# Patient Record
Sex: Female | Born: 1945 | Race: White | Hispanic: No | Marital: Married | Smoking: Never smoker
Health system: Southern US, Community
[De-identification: ages and names within clinical notes are randomized; demographics above are authoritative.]

## PROBLEM LIST (undated history)

## (undated) DIAGNOSIS — E119 Type 2 diabetes mellitus without complications: Secondary | ICD-10-CM

## (undated) DIAGNOSIS — I10 Essential (primary) hypertension: Secondary | ICD-10-CM

## (undated) DIAGNOSIS — Z780 Asymptomatic menopausal state: Secondary | ICD-10-CM

## (undated) DIAGNOSIS — E785 Hyperlipidemia, unspecified: Secondary | ICD-10-CM

## (undated) DIAGNOSIS — E1169 Type 2 diabetes mellitus with other specified complication: Secondary | ICD-10-CM

## (undated) DIAGNOSIS — F410 Panic disorder [episodic paroxysmal anxiety] without agoraphobia: Secondary | ICD-10-CM

## (undated) DIAGNOSIS — M549 Dorsalgia, unspecified: Secondary | ICD-10-CM

## (undated) DIAGNOSIS — IMO0001 Reserved for inherently not codable concepts without codable children: Secondary | ICD-10-CM

## (undated) DIAGNOSIS — M810 Age-related osteoporosis without current pathological fracture: Secondary | ICD-10-CM

## (undated) HISTORY — DX: Age-related osteoporosis without current pathological fracture: M81.0

## (undated) HISTORY — DX: Type 2 diabetes mellitus without complications: E11.9

## (undated) HISTORY — DX: Type 2 diabetes mellitus with other specified complication: E78.5

## (undated) HISTORY — PX: OTHER SURGICAL HISTORY: SHX169

## (undated) HISTORY — PX: TRIGGER FINGER RELEASE: SHX641

## (undated) HISTORY — DX: Type 2 diabetes mellitus with other specified complication: E11.69

---

## 2009-06-03 ENCOUNTER — Observation Stay (HOSPITAL_COMMUNITY)
Admission: EM | Admit: 2009-06-03 | Discharge: 2009-06-04 | Payer: Self-pay | Source: Home / Self Care | Admitting: Emergency Medicine

## 2009-06-03 ENCOUNTER — Ambulatory Visit: Payer: Self-pay | Admitting: Interventional Radiology

## 2009-06-03 ENCOUNTER — Encounter: Payer: Self-pay | Admitting: Emergency Medicine

## 2009-06-09 ENCOUNTER — Encounter: Admission: RE | Admit: 2009-06-09 | Discharge: 2009-06-09 | Payer: Self-pay | Admitting: Orthopedic Surgery

## 2009-07-04 ENCOUNTER — Encounter
Admission: RE | Admit: 2009-07-04 | Discharge: 2009-10-02 | Payer: Self-pay | Source: Home / Self Care | Admitting: Orthopedic Surgery

## 2009-10-10 ENCOUNTER — Encounter
Admission: RE | Admit: 2009-10-10 | Discharge: 2010-01-08 | Payer: Self-pay | Source: Home / Self Care | Admitting: Orthopedic Surgery

## 2010-04-24 LAB — BASIC METABOLIC PANEL
BUN: 9 mg/dL (ref 6–23)
Chloride: 106 mEq/L (ref 96–112)
Chloride: 107 mEq/L (ref 96–112)
Creatinine, Ser: 0.89 mg/dL (ref 0.4–1.2)
Creatinine, Ser: 0.97 mg/dL (ref 0.4–1.2)
GFR calc Af Amer: >60 mL/min (ref >60)
GFR calc non Af Amer: >60 mL/min (ref >60)
Glucose, Bld: 193 mg/dL — ABNORMAL HIGH (ref 70–99)
Potassium: 3.6 mEq/L (ref 3.5–5.1)
Sodium: 141 mEq/L (ref 135–145)

## 2010-04-24 LAB — DIFFERENTIAL
Basophils Relative: 1 % (ref 0–1)
Eosinophils Absolute: 0 10*3/uL (ref 0.0–0.7)
Lymphocytes Relative: 6 % — ABNORMAL LOW (ref 12–46)
Monocytes Absolute: 0.5 10*3/uL (ref 0.1–1.0)
Monocytes Relative: 4 % (ref 3–12)

## 2010-04-24 LAB — CBC
HCT: 39.3 % (ref 36.0–46.0)
Hemoglobin: 13.3 g/dL (ref 12.0–15.0)
MCHC: 33.8 g/dL (ref 30.0–36.0)
MCV: 87.5 fL (ref 78.0–100.0)
MCV: 86.1 fL (ref 78.0–100.0)
Platelets: 179 10*3/uL (ref 150–400)
RDW: 13.4 % (ref 11.5–15.5)
WBC: 10.6 10*3/uL — ABNORMAL HIGH (ref 4.0–10.5)

## 2011-01-13 IMAGING — CT CT 3D INDEPENDENT [facility]
3 of 4 series · 7 of 14 positions shown, 8 images · non-contrast
Comparison: Left shoulder CT 06/03/2009.

CLINICAL DATA: Shoulder pain status post fall 06/03/2009.
Evaluate for fracture/dislocation.

CT OF THE LEFT SHOULDER WITHOUT CONTRAST
TECHNIQUE: Multidetector CT imaging of the left shoulder was
performed according to the standard protocol without intravenous
contrast. Multiplanar CT image reconstructions were also generated.

[Series 3: shoulder bone · axial · 0.42mm/px · z∈[-154,-12]mm · 3 of 58 slices shown, 4 images]
[im 1/58  soft-tissue]
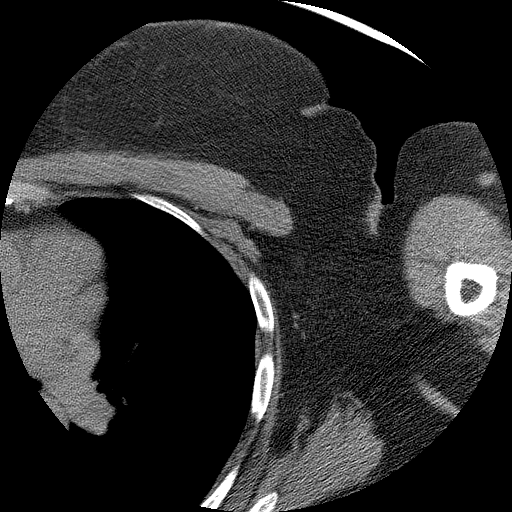
[im 1/58  bone]
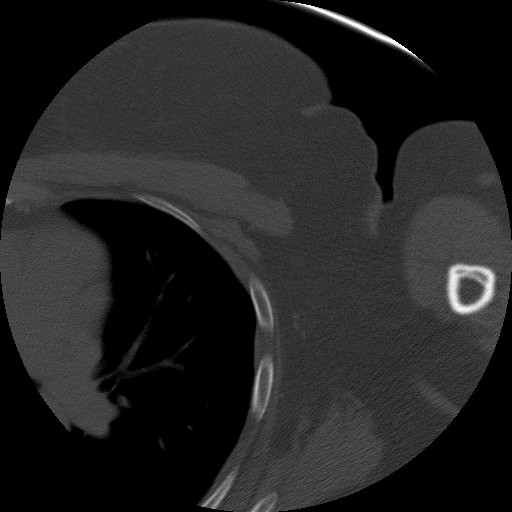
[im 29/58  bone]
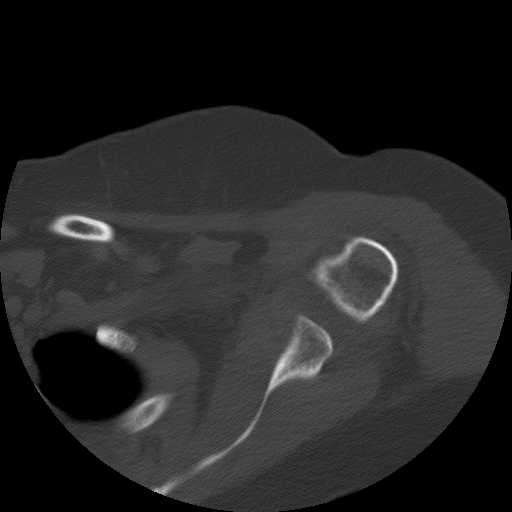
[im 58/58  bone]
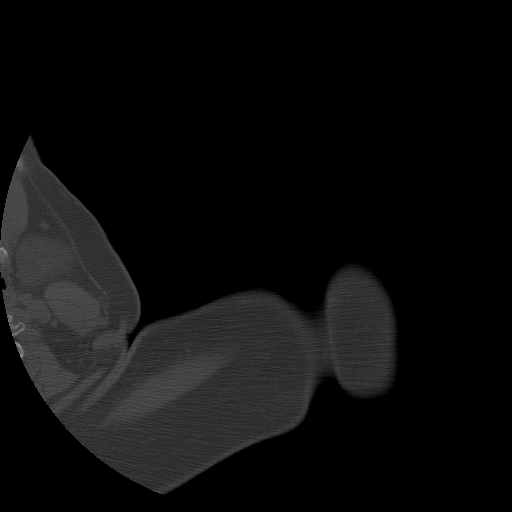

[Series 200: reformat · oblique · 0.42mm/px · 2 of 69 slices shown (1 of 2)]
[im 23/69  bone]
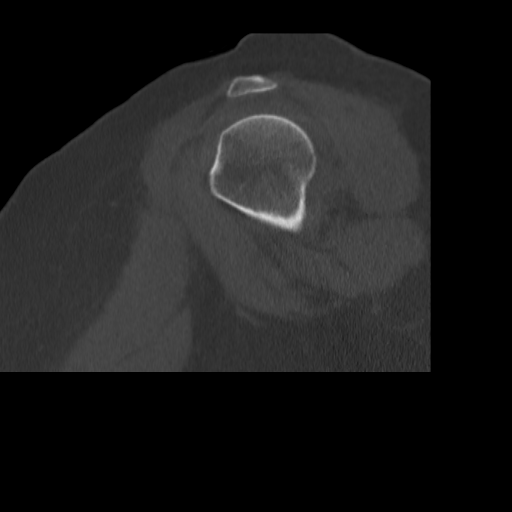
[im 46/69  bone]
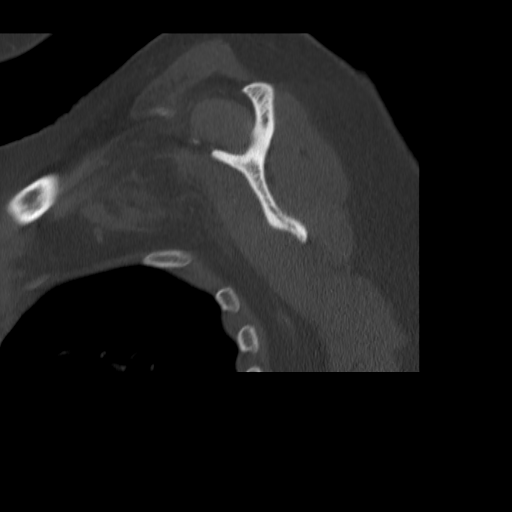

[Series 201: reformat · oblique · 0.42mm/px · 2 of 69 slices shown (2 of 2)]
[im 23/69  bone]
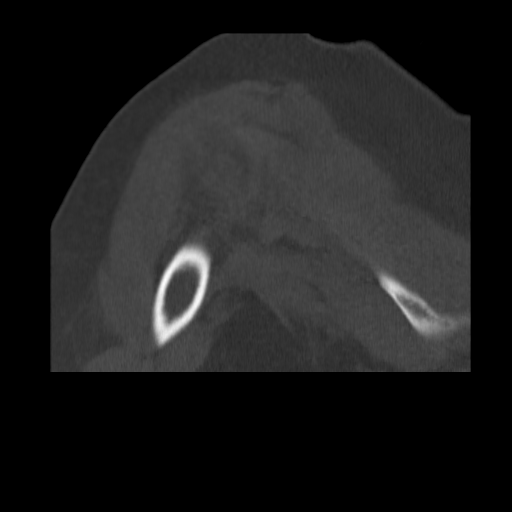
[im 46/69  bone]
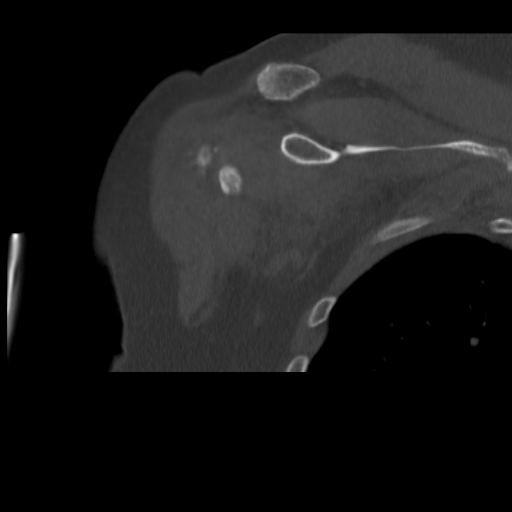

[7 of 14 positions shown; findings below may reference images not displayed]

FINDINGS: Prior examination demonstrated anterior inferior
dislocation of the humeral head with a luxatio erecta deformity.
The dislocation has been reduced.  The humeral head now articulates
normally with the glenoid.  The articular surface of the humeral
head is intact.  There is a comminuted fracture of the greater
tuberosity.  This is mildly displaced.  Components involving the
bicipital groove are not displaced.

There is no evidence of glenoid or other scapular fracture.  There
are moderate acromioclavicular degenerative changes.

Soft tissue edema is noted within the axilla surrounding the
subclavian and axillary vessels.  This is likely related to
hemorrhage from the recent dislocation.  There is no large focal
hematoma.  Injury of the brachial plexus would be possible in this
context.

No rib fracture is identified.  The visualized left lung appears
unremarkable.
IMPRESSION: 1.  Reduction of luxatio erecta deformity.
2.  Comminuted mildly displaced fracture of the left humeral
greater tuberosity.  No involvement of the humeral head articular
surface or glenoid is identified.
3.  Soft tissue injury in the axilla related to recent dislocation.
There is potential injury to the brachial plexus - correlate
clinically.

3-DIMENSIONAL CT IMAGE RENDERING AT INDEPENDENT WORKSTATION:

3-dimensional CT images were rendered by post-processing of the
original CT data at independent workstation.  The 3-dimensional CT
images were interpreted, and findings were reported in the
accompanying complete CT report for this study.

## 2014-02-28 ENCOUNTER — Encounter (HOSPITAL_BASED_OUTPATIENT_CLINIC_OR_DEPARTMENT_OTHER): Payer: Self-pay | Admitting: Emergency Medicine

## 2014-02-28 ENCOUNTER — Emergency Department (HOSPITAL_BASED_OUTPATIENT_CLINIC_OR_DEPARTMENT_OTHER)
Admission: EM | Admit: 2014-02-28 | Discharge: 2014-02-28 | Disposition: A | Discharge status: Home / Self Care | Payer: Worker's Compensation | Attending: Emergency Medicine | Admitting: Emergency Medicine

## 2014-02-28 ENCOUNTER — Emergency Department (HOSPITAL_BASED_OUTPATIENT_CLINIC_OR_DEPARTMENT_OTHER): Payer: Worker's Compensation

## 2014-02-28 DIAGNOSIS — Y9289 Other specified places as the place of occurrence of the external cause: Secondary | ICD-10-CM | POA: Diagnosis not present

## 2014-02-28 DIAGNOSIS — Z79899 Other long term (current) drug therapy: Secondary | ICD-10-CM | POA: Insufficient documentation

## 2014-02-28 DIAGNOSIS — W01198A Fall on same level from slipping, tripping and stumbling with subsequent striking against other object, initial encounter: Secondary | ICD-10-CM | POA: Insufficient documentation

## 2014-02-28 DIAGNOSIS — S0003XA Contusion of scalp, initial encounter: Secondary | ICD-10-CM | POA: Insufficient documentation

## 2014-02-28 DIAGNOSIS — I10 Essential (primary) hypertension: Secondary | ICD-10-CM | POA: Diagnosis not present

## 2014-02-28 DIAGNOSIS — Y998 Other external cause status: Secondary | ICD-10-CM | POA: Insufficient documentation

## 2014-02-28 DIAGNOSIS — F410 Panic disorder [episodic paroxysmal anxiety] without agoraphobia: Secondary | ICD-10-CM | POA: Insufficient documentation

## 2014-02-28 DIAGNOSIS — Y9389 Activity, other specified: Secondary | ICD-10-CM | POA: Insufficient documentation

## 2014-02-28 DIAGNOSIS — Z7952 Long term (current) use of systemic steroids: Secondary | ICD-10-CM | POA: Insufficient documentation

## 2014-02-28 DIAGNOSIS — E118 Type 2 diabetes mellitus with unspecified complications: Secondary | ICD-10-CM | POA: Diagnosis not present

## 2014-02-28 DIAGNOSIS — S0990XA Unspecified injury of head, initial encounter: Secondary | ICD-10-CM | POA: Diagnosis present

## 2014-02-28 DIAGNOSIS — W19XXXA Unspecified fall, initial encounter: Secondary | ICD-10-CM

## 2014-02-28 HISTORY — DX: Type 2 diabetes mellitus without complications: E11.9

## 2014-02-28 HISTORY — DX: Asymptomatic menopausal state: Z78.0

## 2014-02-28 HISTORY — DX: Dorsalgia, unspecified: M54.9

## 2014-02-28 HISTORY — DX: Reserved for inherently not codable concepts without codable children: IMO0001

## 2014-02-28 HISTORY — DX: Panic disorder (episodic paroxysmal anxiety): F41.0

## 2014-02-28 HISTORY — DX: Essential (primary) hypertension: I10

## 2014-02-28 NOTE — Discharge Instructions (Signed)
Contusion °A contusion is a deep bruise. Contusions happen when an injury causes bleeding under the skin. Signs of bruising include pain, puffiness (swelling), and discolored skin. The contusion may turn blue, purple, or yellow. °HOME CARE  °· Put ice on the injured area. °¨ Put ice in a plastic bag. °¨ Place a towel between your skin and the bag. °¨ Leave the ice on for 15-20 minutes, 03-04 times a day. °· Only take medicine as told by your doctor. °· Rest the injured area. °· If possible, raise (elevate) the injured area to lessen puffiness. °GET HELP RIGHT AWAY IF:  °· You have more bruising or puffiness. °· You have pain that is getting worse. °· Your puffiness or pain is not helped by medicine. °MAKE SURE YOU:  °· Understand these instructions. °· Will watch your condition. °· Will get help right away if you are not doing well or get worse. °Document Released: 07/10/2007 Document Revised: 04/15/2011 Document Reviewed: 11/26/2010 °[employer]® Patient Information ©2015 [employer]. This information is not intended to replace advice given to you by your health care provider. Make sure you discuss any questions you have with your health care provider. ° °

## 2014-02-28 NOTE — ED Notes (Signed)
Pt fell on ice in parking lot.  Pt htit back of head but no LOC.  Pt has some dizziness but is not new for pt.  No N/V.

## 2014-02-28 NOTE — ED Provider Notes (Signed)
CSN: [id number]     Arrival date & time 02/28/14  0935 History   First MD Initiated Contact with Patient 02/28/14 [phone]     Chief Complaint  Patient presents with  . Fall     (Consider location/radiation/quality/duration/timing/severity/associated sxs/prior Treatment) HPI Comments: Over the last few weeks pt has had intermittent vertigo when moving her head and changing positions.  Since the fall has had slightly more but not dizzy prior to fall this morning.  Patient is a 69 y.o. female presenting with fall. The history is provided by the patient.  Fall This is a new (Was in the parking lot at work walking into the office and slipped and fell on the ice falling backwards and hitting her head on the pavement) problem. The current episode started 1 to 2 hours ago. The problem occurs constantly. The problem has not changed since onset.Associated symptoms include headaches. Pertinent negatives include no chest pain, no abdominal pain and no shortness of breath. Associated symptoms comments: No neck pain.  No numbness/weakness of ext.  No N/V.  Able to walk without difficulty.  No vision changes.  No LOC. Nothing aggravates the symptoms. The symptoms are relieved by ice. She has tried a cold compress for the symptoms. The treatment provided mild relief.    Past Medical History  Diagnosis Date  . Hypertension   . Diabetes mellitus without complication   . Post-menopause   . Gas   . Panic attack   . Back pain    Past Surgical History  Procedure Laterality Date  . Trigger finger release    . Neck nerve block     No family history on file. History  Substance Use Topics  . Smoking status: Never Smoker   . Smokeless tobacco: Not on file  . Alcohol Use: Not on file   OB History    No data available     Review of Systems  Respiratory: Negative for shortness of breath.   Cardiovascular: Negative for chest pain.  Gastrointestinal: Negative for abdominal pain.  Neurological: Positive  for headaches.  All other systems reviewed and are negative.     Allergies  Sulfa antibiotics and Tape  Home Medications   Prior to Admission medications   Medication Sig Start Date End Date Taking? Authorizing Provider  ALPRAZolam (XANAX XR) 0.5 MG 24 hr tablet Take 0.5 mg by mouth daily as needed for anxiety.   Yes Historical Provider, MD  atenolol (TENORMIN) 50 MG tablet Take 50 mg by mouth daily.   Yes Historical Provider, MD  azelastine (ASTELIN) 0.1 % nasal spray Place 2 sprays into both nostrils 2 (two) times daily. Use in each nostril as directed   Yes Historical Provider, MD  Calcium Carbonate-Vit D-Min (CALCIUM 1200) 1200-1000 MG-UNIT CHEW Chew 1 tablet by mouth daily.   Yes Historical Provider, MD  cyclobenzaprine (FLEXERIL) 10 MG tablet Take 10 mg by mouth 3 (three) times daily as needed for muscle spasms.   Yes Historical Provider, MD  levocetirizine (XYZAL) 5 MG tablet Take 5 mg by mouth daily as needed for allergies.   Yes Historical Provider, MD  losartan-hydrochlorothiazide (HYZAAR) 100-25 MG per tablet Take 1 tablet by mouth daily.   Yes Historical Provider, MD  metFORMIN (GLUCOPHAGE) 500 MG tablet Take 500 mg by mouth 2 (two) times daily with a meal.   Yes Historical Provider, MD  Multiple Vitamins-Minerals (CENTRUM ADULTS PO) Take 1 tablet by mouth.   Yes Historical Provider, MD  potassium chloride (KLOR-CON) 20  MEQ packet Take 20 mEq by mouth 2 (two) times daily.   Yes Historical Provider, MD  raloxifene (EVISTA) 60 MG tablet Take 60 mg by mouth daily.   Yes Historical Provider, MD  simethicone (MYLICON) 125 MG chewable tablet Chew 125 mg by mouth every 6 (six) hours as needed for flatulence.   Yes Historical Provider, MD  simvastatin (ZOCOR) 40 MG tablet Take 40 mg by mouth daily.   Yes Historical Provider, MD   BP 151/75 mmHg  Pulse 68  Temp(Src) 98.3 F (36.8 C) (Oral)  Resp 18  Ht 5\' 5"  (1.651 m)  Wt 192 lb (87.091 kg)  BMI 31.95 kg/m2  SpO2 98% Physical  Exam  Constitutional: She is oriented to person, place, and time. She appears well-developed and well-nourished. No distress.  HENT:  Head: Normocephalic. Head is with contusion.    Eyes: EOM are normal. Pupils are equal, round, and reactive to light.  Neck: No spinous process tenderness and no muscular tenderness present.  Cardiovascular: Normal rate, regular rhythm, normal heart sounds and intact distal pulses.  Exam reveals no friction rub.   No murmur heard. Pulmonary/Chest: Effort normal and breath sounds normal. She has no wheezes. She has no rales.  Abdominal: Soft. Bowel sounds are normal. She exhibits no distension. There is no tenderness. There is no rebound and no guarding.  Musculoskeletal: Normal range of motion. She exhibits no tenderness.  No edema  Neurological: She is alert and oriented to person, place, and time. She has normal strength. No cranial nerve deficit or sensory deficit. Gait normal.  Skin: Skin is warm and dry. No rash noted.  Psychiatric: She has a normal mood and affect. Her behavior is normal.  Nursing note and vitals reviewed.   ED Course  Procedures (including critical care time) Labs Review Labs Reviewed - No data to display  Imaging Review Ct Head Wo Contrast  02/28/2014   CLINICAL DATA:  Larey SeatFell on ice, striking back of head. No loss of consciousness.  EXAM: CT HEAD WITHOUT CONTRAST  TECHNIQUE: Contiguous axial images were obtained from the base of the skull through the vertex without intravenous contrast.  COMPARISON:  None.  FINDINGS: There is no intracranial hemorrhage or extra-axial fluid collection.  The brain and CSF spaces appear unremarkable.  The calvarium and skullbase are intact.  IMPRESSION: No acute findings   Electronically Signed   By: Ellery Plunkaniel R Mitchell M.D.   On: 02/28/2014 10:49     EKG Interpretation None      MDM   Final diagnoses:  Fall  Scalp hematoma, initial encounter    Patient had a mechanical fall today on the ice  falling backwards and hitting her head on the pavement. She has a hematoma to the occiput of her head but denies LOC. She takes no anticoagulation. No neck pain. Neurovascularly intact. She has had intermittent vertigo and dizziness over the last few weeks which is slightly worse after the fall. She states it's only with head movements. CT of the head pending  10:58 AM CT neg.  Will d/c home.  Gwyneth SproutWhitney Gila Lauf, MD 02/28/14 1059

## 2014-10-01 ENCOUNTER — Emergency Department (HOSPITAL_BASED_OUTPATIENT_CLINIC_OR_DEPARTMENT_OTHER)
Admission: EM | Admit: 2014-10-01 | Discharge: 2014-10-02 | Disposition: A | Discharge status: Home / Self Care | Payer: Medicare Other | Attending: Emergency Medicine | Admitting: Emergency Medicine

## 2014-10-01 ENCOUNTER — Encounter (HOSPITAL_BASED_OUTPATIENT_CLINIC_OR_DEPARTMENT_OTHER): Payer: Self-pay | Admitting: Emergency Medicine

## 2014-10-01 DIAGNOSIS — E119 Type 2 diabetes mellitus without complications: Secondary | ICD-10-CM | POA: Insufficient documentation

## 2014-10-01 DIAGNOSIS — R1011 Right upper quadrant pain: Secondary | ICD-10-CM | POA: Insufficient documentation

## 2014-10-01 DIAGNOSIS — M549 Dorsalgia, unspecified: Secondary | ICD-10-CM | POA: Diagnosis not present

## 2014-10-01 DIAGNOSIS — R112 Nausea with vomiting, unspecified: Secondary | ICD-10-CM | POA: Diagnosis not present

## 2014-10-01 DIAGNOSIS — R945 Abnormal results of liver function studies: Secondary | ICD-10-CM

## 2014-10-01 DIAGNOSIS — Z79899 Other long term (current) drug therapy: Secondary | ICD-10-CM | POA: Insufficient documentation

## 2014-10-01 DIAGNOSIS — R7989 Other specified abnormal findings of blood chemistry: Secondary | ICD-10-CM | POA: Insufficient documentation

## 2014-10-01 DIAGNOSIS — F410 Panic disorder [episodic paroxysmal anxiety] without agoraphobia: Secondary | ICD-10-CM | POA: Insufficient documentation

## 2014-10-01 DIAGNOSIS — I10 Essential (primary) hypertension: Secondary | ICD-10-CM | POA: Diagnosis not present

## 2014-10-01 LAB — CBC WITH DIFFERENTIAL/PLATELET
BASOS ABS: 0 10*3/uL (ref 0.0–0.1)
BASOS PCT: 0 % (ref 0–1)
EOS ABS: 0 10*3/uL (ref 0.0–0.7)
Eosinophils Relative: 1 % (ref 0–5)
HEMATOCRIT: 40.5 % (ref 36.0–46.0)
HEMOGLOBIN: 13.9 g/dL (ref 12.0–15.0)
Lymphocytes Relative: 13 % (ref 12–46)
Lymphs Abs: 1 10*3/uL (ref 0.7–4.0)
MCH: 29.3 pg (ref 26.0–34.0)
MCHC: 34.3 g/dL (ref 30.0–36.0)
MCV: 85.3 fL (ref 78.0–100.0)
MONOS PCT: 5 % (ref 3–12)
Monocytes Absolute: 0.3 10*3/uL (ref 0.1–1.0)
NEUTROS ABS: 6.1 10*3/uL (ref 1.7–7.7)
NEUTROS PCT: 81 % — AB (ref 43–77)
Platelets: 193 10*3/uL (ref 150–400)
RBC: 4.75 MIL/uL (ref 3.87–5.11)
RDW: 13.2 % (ref 11.5–15.5)
WBC: 7.5 10*3/uL (ref 4.0–10.5)

## 2014-10-01 MED ORDER — FENTANYL CITRATE (PF) 100 MCG/2ML IJ SOLN
100.0000 ug | Freq: Once | INTRAMUSCULAR | Status: AC
  Administered 2014-10-02: 100 ug via INTRAVENOUS
  Filled 2014-10-01: qty 2

## 2014-10-01 MED ORDER — ONDANSETRON HCL 4 MG/2ML IJ SOLN
4.0000 mg | Freq: Once | INTRAMUSCULAR | Status: AC
Start: 2014-10-02 — End: 2014-10-02
  Administered 2014-10-02: 4 mg via INTRAVENOUS
  Filled 2014-10-01: qty 2

## 2014-10-01 NOTE — ED Provider Notes (Signed)
CSN: [id number]     Arrival date & time 10/01/14  2255 History  This chart was scribed for Cassie Libra, MD by Budd Palmer, ED Scribe. This patient was seen in room MH08/MH08 and the patient's care was started at 11:49 PM.    Chief Complaint  Patient presents with  . Abdominal Pain   The history is provided by the patient. No language interpreter was used.   HPI Comments: Ahja Martello is a 69 y.o. female with a PMHx of HTN and DM who presents to the Emergency Department complaining of moderate, "gassy" abdominal pain onset 6 hours ago. She reports associated bloating, nausea, vomiting, and severe pain between the shoulder blades (mostly on the right) radiating down to her waist. She has taken OTC medication with no relief. She notes exacerbation of the pain with movement. She has no history of gallbladder disease.  Past Medical History  Diagnosis Date  . Hypertension   . Diabetes mellitus without complication   . Post-menopause   . Gas   . Panic attack   . Back pain    Past Surgical History  Procedure Laterality Date  . Trigger finger release    . Neck nerve block     History reviewed. No pertinent family history. Social History  Substance Use Topics  . Smoking status: Never Smoker   . Smokeless tobacco: None  . Alcohol Use: None   OB History    No data available     Review of Systems  Gastrointestinal: Positive for nausea, vomiting and abdominal pain.  Musculoskeletal: Positive for back pain.  All other systems reviewed and are negative.   Allergies  Sulfa antibiotics and Tape  Home Medications   Prior to Admission medications   Medication Sig Start Date End Date Taking? Authorizing Provider  ALPRAZolam (XANAX XR) 0.5 MG 24 hr tablet Take 0.5 mg by mouth daily as needed for anxiety.    Historical Provider, MD  atenolol (TENORMIN) 50 MG tablet Take 50 mg by mouth daily.    Historical Provider, MD  azelastine (ASTELIN) 0.1 % nasal spray Place 2 sprays into both  nostrils 2 (two) times daily. Use in each nostril as directed    Historical Provider, MD  Calcium Carbonate-Vit D-Min (CALCIUM 1200) 1200-1000 MG-UNIT CHEW Chew 1 tablet by mouth daily.    Historical Provider, MD  cyclobenzaprine (FLEXERIL) 10 MG tablet Take 10 mg by mouth 3 (three) times daily as needed for muscle spasms.    Historical Provider, MD  levocetirizine (XYZAL) 5 MG tablet Take 5 mg by mouth daily as needed for allergies.    Historical Provider, MD  losartan-hydrochlorothiazide (HYZAAR) 100-25 MG per tablet Take 1 tablet by mouth daily.    Historical Provider, MD  metFORMIN (GLUCOPHAGE) 500 MG tablet Take 500 mg by mouth 2 (two) times daily with a meal.    Historical Provider, MD  Multiple Vitamins-Minerals (CENTRUM ADULTS PO) Take 1 tablet by mouth.    Historical Provider, MD  potassium chloride (KLOR-CON) 20 MEQ packet Take 20 mEq by mouth 2 (two) times daily.    Historical Provider, MD  raloxifene (EVISTA) 60 MG tablet Take 60 mg by mouth daily.    Historical Provider, MD  simethicone (MYLICON) 125 MG chewable tablet Chew 125 mg by mouth every 6 (six) hours as needed for flatulence.    Historical Provider, MD  simvastatin (ZOCOR) 40 MG tablet Take 40 mg by mouth daily.    Historical Provider, MD   BP 122/52 mmHg  Pulse 61  Temp(Src) 97.5 F (36.4 C) (Oral)  Resp 15  Ht 5\' 6"  (1.676 m)  Wt 192 lb (87.091 kg)  BMI 31.00 kg/m2  SpO2 98%   Physical Exam General: Well-developed, well-nourished female in no acute distress; appearance consistent with age of record HENT: normocephalic; atraumatic Eyes: pupils equal, round and reactive to light; extraocular muscles intact Neck: supple Heart: regular rate and rhythm;  Lungs: clear to auscultation bilaterally Abdomen: soft; nondistended; no masses or hepatosplenomegaly; bowel sounds present; epigastric and RUQ TTP Extremities: No deformity; full range of motion; pulses normal Neurologic: Awake, alert and oriented; motor function  intact in all extremities and symmetric; no facial droop Skin: Warm and dry Psychiatric: Normal mood and affect   ED Course  Procedures  DIAGNOSTIC STUDIES: Oxygen Saturation is 100% on RA, normal by my interpretation.    COORDINATION OF CARE: 12:00 AM - Discussed plans to order anti-nausea medication and pain medication. Pt advised of plan for treatment and pt agrees.   EMERGENCY DEPARTMENT US GALLBLADDER INTERPRETATION "Study: Limited Ultrasound of the gallbladder and common bile duct."  INDICATIONS: RUQ pain Indication: Multiple views of the gallbladder and common bile duct are obtained with a  Multi-frequency probe."  PERFORMED BY:  Myself  IMAGES ARCHIVED?: Yes  FINDINGS: Gallstones present, Gallbladder wall normal in thickness, Sonographic Murphy's sign present and Common bile duct normal in size  INTERPRETATION: Cholelithiasis  COMMENT:  Gallstones small, few, with small amount of sludge         MDM   Nursing notes and vitals signs, including pulse oximetry, reviewed.  Summary of this visit's results, reviewed by myself:  Labs:  Results for orders placed or performed during the hospital encounter of 10/01/14 (from the past 24 hour(s))  Lipase, blood     Status: None   Collection Time: 10/01/14 11:45 PM  Result Value Ref Range   Lipase 32 22 - 51 U/L  Comprehensive metabolic panel     Status: Abnormal   Collection Time: 10/01/14 11:45 PM  Result Value Ref Range   Sodium 141 135 - 145 mmol/L   Potassium 3.9 3.5 - 5.1 mmol/L   Chloride 102 101 - 111 mmol/L   CO2 26 22 - 32 mmol/L   Glucose, Bld 189 (H) 65 - 99 mg/dL   BUN 18 6 - 20 mg/dL   Creatinine, Ser 1.61 0.44 - 1.00 mg/dL   Calcium 9.5 8.9 - 09.6 mg/dL   Total Protein 6.8 6.5 - 8.1 g/dL   Albumin 4.0 3.5 - 5.0 g/dL   AST 045 (H) 15 - 41 U/L   ALT 183 (H) 14 - 54 U/L   Alkaline Phosphatase 86 38 - 126 U/L   Total Bilirubin 2.0 (H) 0.3 - 1.2 mg/dL   GFR calc non Af Amer 58 (L) >60 mL/min    GFR calc Af Amer >60 >60 mL/min   Anion gap 13 5 - 15  CBC with Differential     Status: Abnormal   Collection Time: 10/01/14 11:45 PM  Result Value Ref Range   WBC 7.5 4.0 - 10.5 K/uL   RBC 4.75 3.87 - 5.11 MIL/uL   Hemoglobin 13.9 12.0 - 15.0 g/dL   HCT 40.9 81.1 - 91.4 %   MCV 85.3 78.0 - 100.0 fL   MCH 29.3 26.0 - 34.0 pg   MCHC 34.3 30.0 - 36.0 g/dL   RDW 78.2 95.6 - 21.3 %   Platelets 193 150 - 400 K/uL   Neutrophils Relative %  81 (H) 43 - 77 %   Neutro Abs 6.1 1.7 - 7.7 K/uL   Lymphocytes Relative 13 12 - 46 %   Lymphs Abs 1.0 0.7 - 4.0 K/uL   Monocytes Relative 5 3 - 12 %   Monocytes Absolute 0.3 0.1 - 1.0 K/uL   Eosinophils Relative 1 0 - 5 %   Eosinophils Absolute 0.0 0.0 - 0.7 K/uL   Basophils Relative 0 0 - 1 %   Basophils Absolute 0.0 0.0 - 0.1 K/uL    Imaging Studies: Ct Abdomen Pelvis W Contrast  10/02/2014   CLINICAL DATA:  Right upper quadrant and right flank pain for 7 hours. Nausea and vomiting. Known gallstone.  EXAM: CT ABDOMEN AND PELVIS WITH CONTRAST  TECHNIQUE: Multidetector CT imaging of the abdomen and pelvis was performed using the standard protocol following bolus administration of intravenous contrast.  CONTRAST:  25mL OMNIPAQUE IOHEXOL 300 MG/ML SOLN, OMNIPAQUE IOHEXOL 300 MG/ML SOLN  COMPARISON:  None.  FINDINGS: Atelectasis in the lung bases.  Coronary artery calcifications.  Diffuse fatty infiltration of the liver. Multiple low-attenuation lesions throughout the liver, some demonstrating contrast fill in on the delayed images. Lesions are likely to represent a combination of cysts and hemangiomas. Cholelithiasis with multiple small stones in the dependent gallbladder. No gallbladder wall thickening or infiltration. No bile duct dilatation. Pancreas, spleen, adrenal glands, inferior vena cava, and retroperitoneal lymph nodes are unremarkable. Calcification of abdominal aorta without aneurysm. Parapelvic cysts in the kidneys. No hydronephrosis.  Nephrograms are symmetrical. Stomach, small bowel, and colon are not abnormally distended. No free air or free fluid in the abdomen. Small umbilical hernia containing fat.  Pelvis: Appendix is normal. Uterus is retroverted. Mass in the uterine fundus measuring 5.2 cm diameter. This could represent fibroid or diffusely enlarged endometrium. Consider ultrasound for further evaluation. No abnormal adnexal masses. No free or loculated pelvic fluid collections. No pelvic mass or lymphadenopathy. Bladder wall is not thickened. Degenerative changes in the spine. Chronic appearing anterior compression of T12. Benign-appearing sclerosis at L4. No destructive bone lesions.  IMPRESSION: Diffuse fatty infiltration in the liver with multiple low-attenuation lesions likely representing cysts and/or hemangiomas. Cholelithiasis. No bile duct dilatation. Parapelvic cysts in the kidneys. Uterine mass versus prominent expansion of the endometrium. Suggest ultrasound for further evaluation.   Electronically Signed   By: Burman Nieves M.D.   On: 10/02/2014 03:09   3:37 AM The patient is asymptomatic at this time. Her abdomen is soft, nontender with no Murphy sign. Her acute onset pain, now resolved, along with elevated LFTs suggest passage of a gallstone. She was advised of these findings and to follow-up with her primary care physician to have her LFTs rechecked. She is a nondrinker.  I personally performed the services described in this documentation, which was scribed in my presence. The recorded information has been reviewed and is accurate.   Cassie Libra, MD 10/02/14 [phone]

## 2014-10-01 NOTE — ED Notes (Signed)
Pt placed on heart monitor.

## 2014-10-01 NOTE — ED Notes (Addendum)
Patient reports that she is having a "gallbladder attack". The patient reports that she is having pain to her mid back radiating to her epigastric region, Pain all in her right arm as well from the epigastric region  Patient also reports bloating and vomiting

## 2014-10-02 ENCOUNTER — Emergency Department (HOSPITAL_BASED_OUTPATIENT_CLINIC_OR_DEPARTMENT_OTHER): Payer: Medicare Other

## 2014-10-02 DIAGNOSIS — R1011 Right upper quadrant pain: Secondary | ICD-10-CM | POA: Diagnosis not present

## 2014-10-02 LAB — COMPREHENSIVE METABOLIC PANEL
ALT: 183 U/L — AB (ref 14–54)
ANION GAP: 13 (ref 5–15)
AST: 306 U/L — ABNORMAL HIGH (ref 15–41)
Albumin: 4 g/dL (ref 3.5–5.0)
Alkaline Phosphatase: 86 U/L (ref 38–126)
BUN: 18 mg/dL (ref 6–20)
CALCIUM: 9.5 mg/dL (ref 8.9–10.3)
CHLORIDE: 102 mmol/L (ref 101–111)
CO2: 26 mmol/L (ref 22–32)
CREATININE: 0.98 mg/dL (ref 0.44–1.00)
GFR, EST NON AFRICAN AMERICAN: 58 mL/min — AB (ref >60)
Glucose, Bld: 189 mg/dL — ABNORMAL HIGH (ref 65–99)
Potassium: 3.9 mmol/L (ref 3.5–5.1)
Sodium: 141 mmol/L (ref 135–145)
Total Bilirubin: 2 mg/dL — ABNORMAL HIGH (ref 0.3–1.2)
Total Protein: 6.8 g/dL (ref 6.5–8.1)

## 2014-10-02 LAB — LIPASE, BLOOD: Lipase: 32 U/L (ref 22–51)

## 2014-10-02 MED ORDER — IOHEXOL 300 MG/ML  SOLN
25.0000 mL | Freq: Once | INTRAMUSCULAR | Status: AC | PRN
  Administered 2014-10-02: 25 mL via ORAL

## 2014-10-02 MED ORDER — IOHEXOL 300 MG/ML  SOLN
100.0000 mL | Freq: Once | INTRAMUSCULAR | Status: AC | PRN
  Administered 2014-10-02: 100 mL via INTRAVENOUS

## 2014-10-02 NOTE — Discharge Instructions (Signed)

## 2014-10-02 NOTE — ED Notes (Signed)
Pt verbalizes understanding of d/c instructions and denies any further needs at this time. 

## 2015-10-04 IMAGING — CT CT HEAD W/O CM
1 series · 16 of 30 positions shown, 20 images · non-contrast
Comparison: None.

CLINICAL DATA: Fell on ice, striking back of head. No loss of
consciousness.

EXAM:
CT HEAD WITHOUT CONTRAST
TECHNIQUE: Contiguous axial images were obtained from the base of the skull
through the vertex without intravenous contrast.

[Series 2: head 4.8 h37s · axial · 0.48mm/px · z∈[+1257,+1417]mm · 16 of 36 slices shown, 20 images]
[im 2/36  brain]
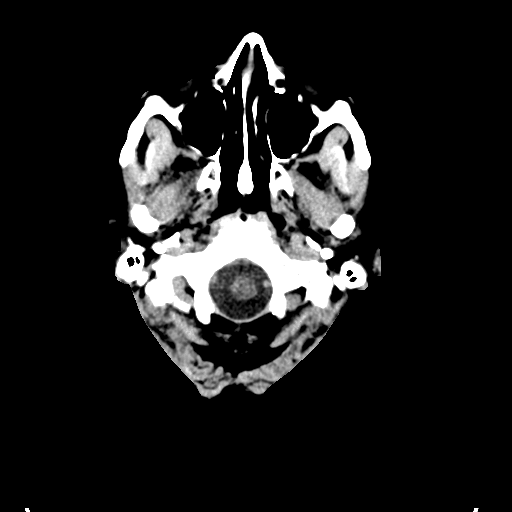
[im 2/36  bone]
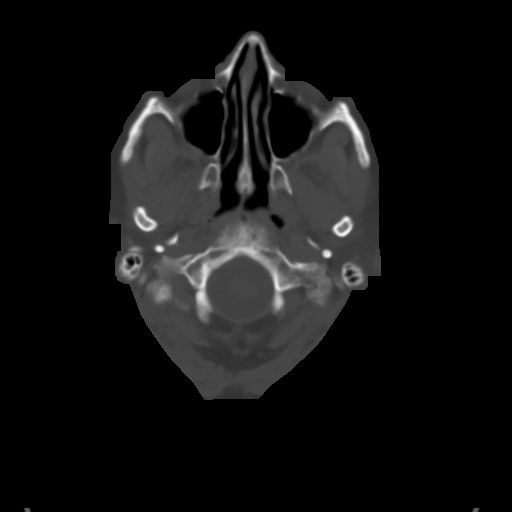
[im 4/36  brain]
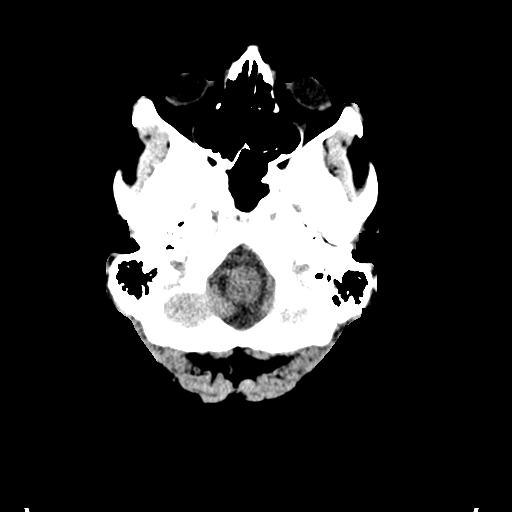
[im 7/36  brain]
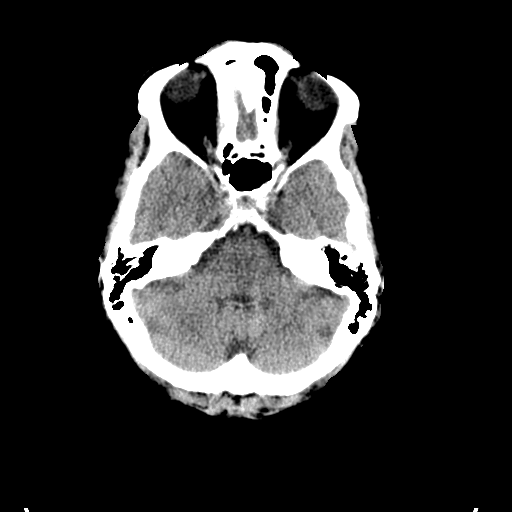
[im 9/36  brain]
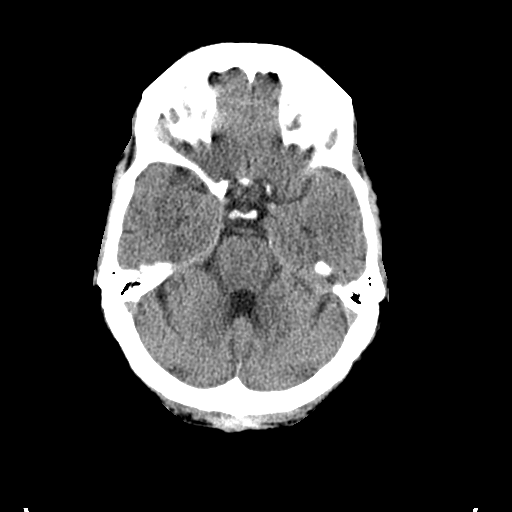
[im 10/36  brain]
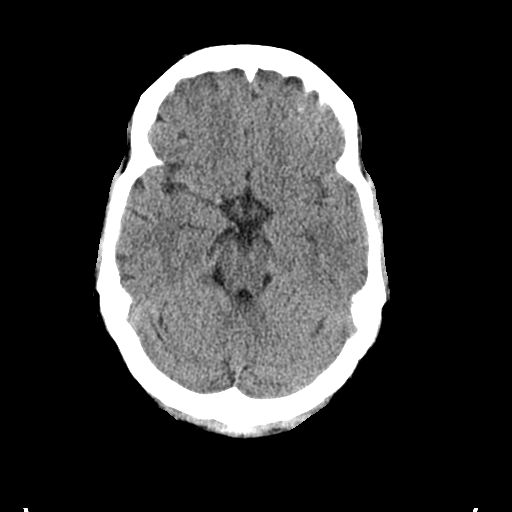
[im 10/36  bone]
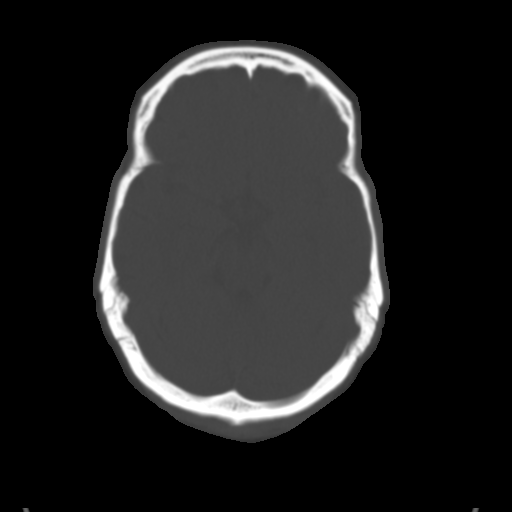
[im 13/36  brain]
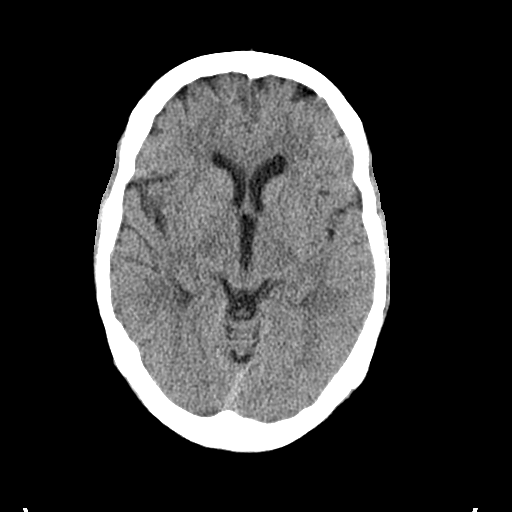
[im 15/36  brain]
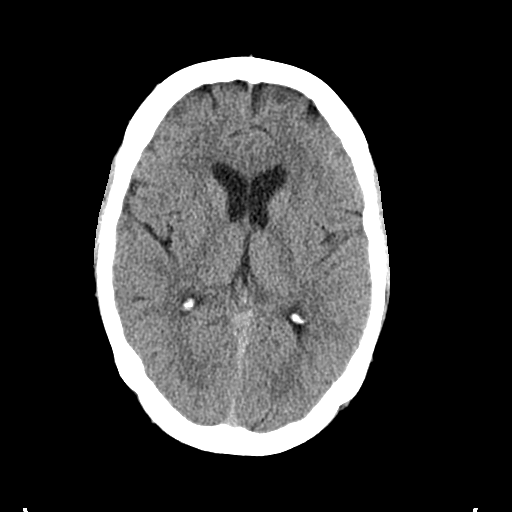
[im 17/36  brain]
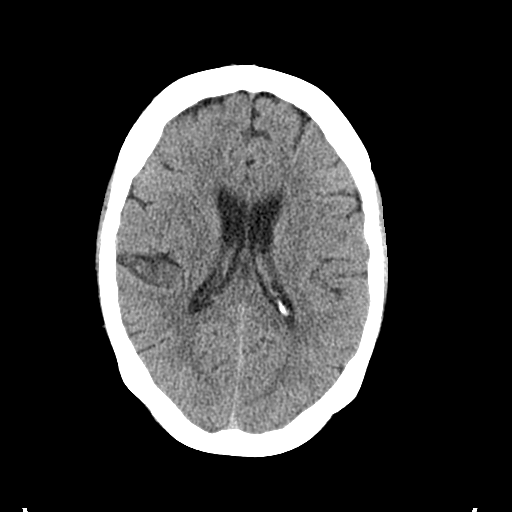
[im 19/36  brain]
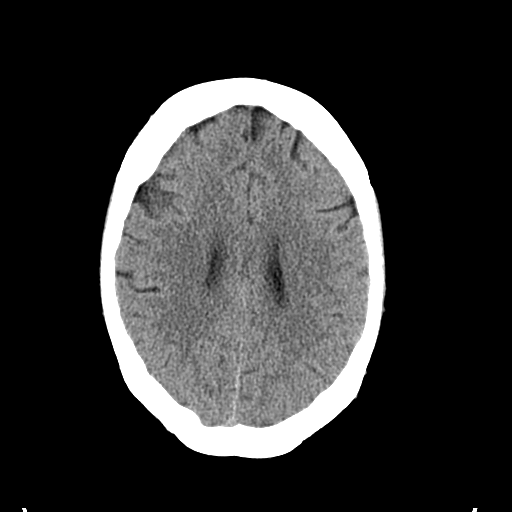
[im 19/36  bone]
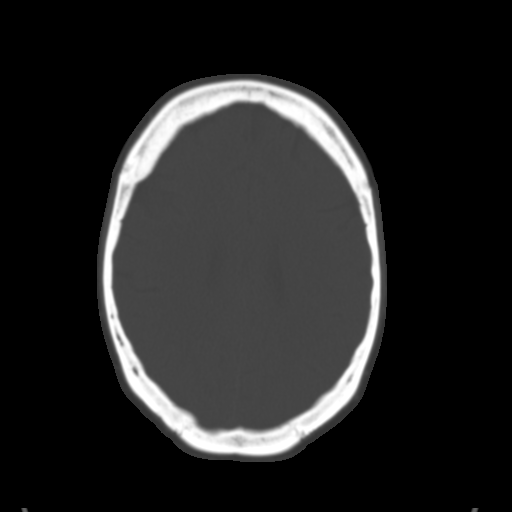
[im 21/36  brain]
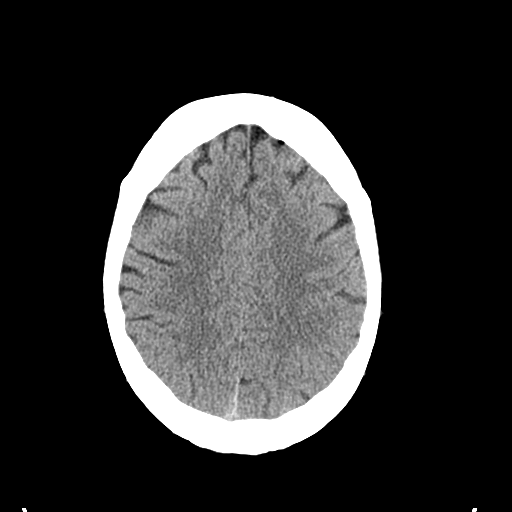
[im 23/36  brain]
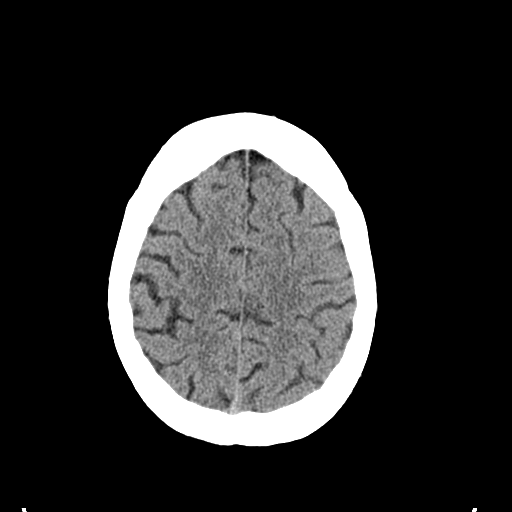
[im 26/36  brain]
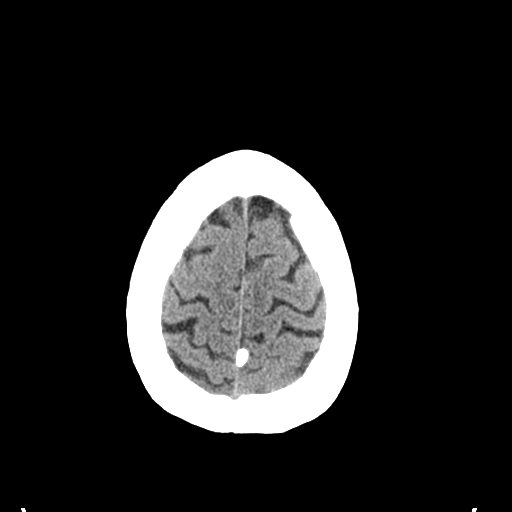
[im 27/36  brain]
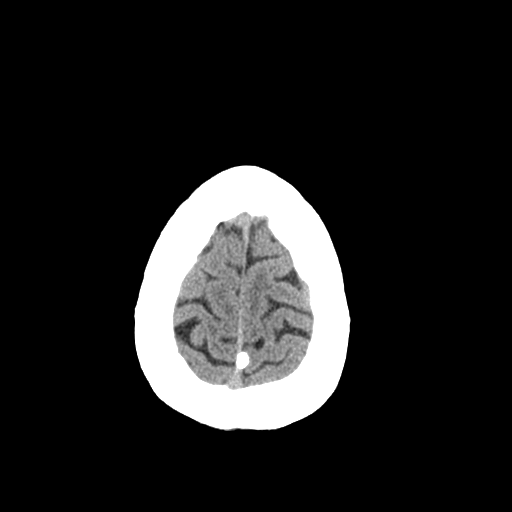
[im 27/36  bone]
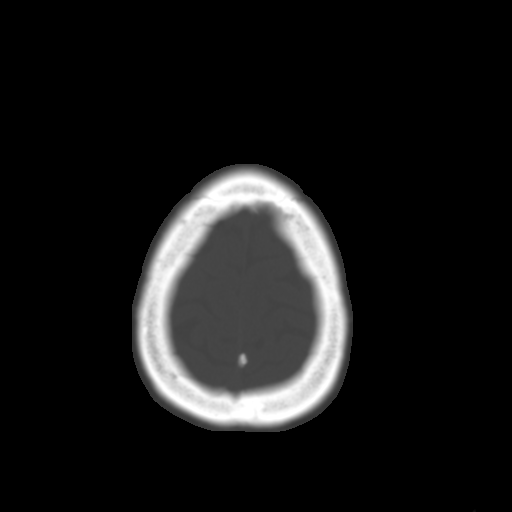
[im 29/36  brain]
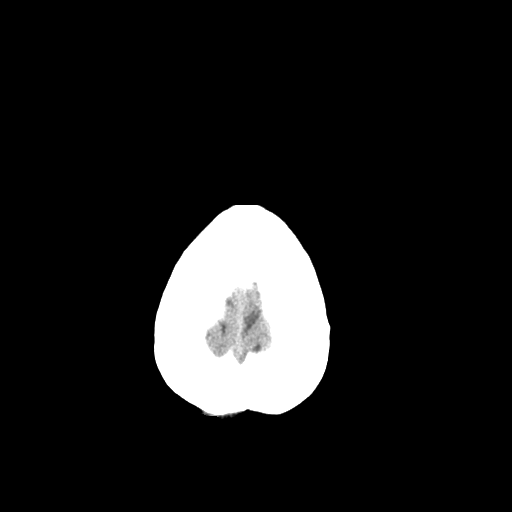
[im 32/36  brain]
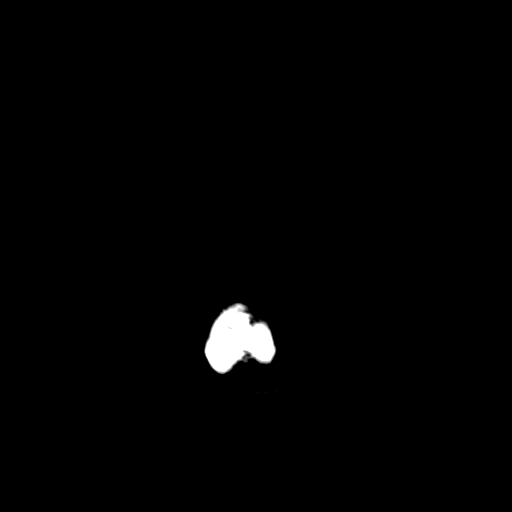
[im 34/36  brain]
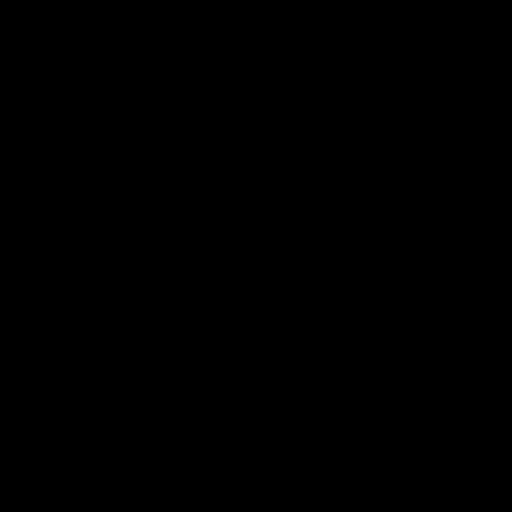

[16 of 30 positions shown; findings below may reference images not displayed]

FINDINGS: There is no intracranial hemorrhage or extra-axial fluid collection.

The brain and CSF spaces appear unremarkable.

The calvarium and skullbase are intact.
IMPRESSION: No acute findings

## 2019-11-30 ENCOUNTER — Ambulatory Visit: Payer: Medicare Other

## 2020-11-21 ENCOUNTER — Encounter: Payer: Self-pay | Admitting: Physical Therapy

## 2020-11-21 ENCOUNTER — Other Ambulatory Visit: Payer: Self-pay

## 2020-11-21 ENCOUNTER — Ambulatory Visit: Payer: Medicare Other | Attending: Orthopedic Surgery | Admitting: Physical Therapy

## 2020-11-21 DIAGNOSIS — M545 Low back pain, unspecified: Secondary | ICD-10-CM | POA: Diagnosis not present

## 2020-11-21 DIAGNOSIS — M6281 Muscle weakness (generalized): Secondary | ICD-10-CM | POA: Insufficient documentation

## 2020-11-21 DIAGNOSIS — G8929 Other chronic pain: Secondary | ICD-10-CM | POA: Diagnosis present

## 2020-11-21 DIAGNOSIS — R29898 Other symptoms and signs involving the musculoskeletal system: Secondary | ICD-10-CM | POA: Diagnosis present

## 2020-11-21 DIAGNOSIS — M62838 Other muscle spasm: Secondary | ICD-10-CM | POA: Diagnosis present

## 2020-11-21 DIAGNOSIS — M25552 Pain in left hip: Secondary | ICD-10-CM | POA: Insufficient documentation

## 2020-11-21 NOTE — Patient Instructions (Signed)
   Access Code: [email or web address] URL: [email or web address] Date: 11/21/2020 Prepared by: Glenetta Hew  Exercises Hooklying Hamstring Stretch with Strap - 2-3 x daily - 7 x weekly - 3 reps - 30 sec hold Supine ITB Stretch with Strap - 2-3 x daily - 7 x weekly - 3 reps - 30 sec hold Supine Piriformis Stretch with Foot on Ground - 2-3 x daily - 7 x weekly - 3 reps - 30 sec hold Supine Figure 4 Piriformis Stretch - 2-3 x daily - 7 x weekly - 3 reps - 30 sec hold

## 2020-11-21 NOTE — Therapy (Signed)
[facility] [address]  [address] [place], [place], [address] Phone: [phone]   Fax:  [phone]  Physical Therapy Evaluation  Patient Details  Name: Cassie Wilson MRN: [id number] Date of Birth: December 18, 1945 Referring Provider (PT): Ollen Gross, MD   Encounter Date: 11/21/2020   PT End of Session - 11/21/20 1530     Visit Number 1    Number of Visits 12    Date for PT Re-Evaluation 01/02/21    Authorization Type Medicare, BCBS, Tricare for Life    PT Start Time 1530    PT Stop Time 1620    PT Time Calculation (min) 50 min    Activity Tolerance Patient tolerated treatment well    Behavior During Therapy [facility] for tasks assessed/performed;Impulsive             Past Medical History:  Diagnosis Date   Back pain    Diabetes mellitus without complication (HCC)    Gas    Hypertension    Panic attack    Post-menopause     Past Surgical History:  Procedure Laterality Date   neck nerve block     TRIGGER FINGER RELEASE      There were no vitals filed for this visit.    Subjective Assessment - 11/21/20 1538     Subjective Pt report long h/o off and on sciatca and hip subluxation issues dating back to her childhood. Current pain started in January 2022 starting in her L SIJ and going down into her L LE. Recent x-rays did not indicate any issues with her hip. Has a dtr who is heavy and w/c bound - she thinks pushing her in the w/c may aggravate the pain.    Limitations House hold activities    Patient Stated Goals "to be able to get up in the morning and stretch out so I can get dressed easier and garden w/o pain"    Currently in Pain? No/denies    Pain Score 0-No pain   up to 3/10 on average   Pain Location Buttocks   ~SIJ   Pain Orientation Left    Pain Descriptors / Indicators Sharp;Aching    Pain Type Chronic pain    Pain Radiating Towards intermittent - usually into L mid lateral thigh    Pain Onset More  than a month ago   exacerbation in January 2022   Pain Frequency Intermittent    Aggravating Factors  prolonged vacuuming, getting OOB/dressed in the morning    Pain Relieving Factors changing position, rest from triggering activity    Effect of Pain on Daily Activities "nothing I can't do" but takes longer to get dressed in morning, limps at times                [facility] PT Assessment - 11/21/20 1530       Assessment   Medical Diagnosis L sided LBP    Referring Provider (PT) Ollen Gross, MD    Onset Date/Surgical Date --   January 2022   Hand Dominance Right    Next MD Visit PRN if PT not helping    Prior Therapy PT for frozen shoulder s/p L shoulder fracture in 2011      Precautions   Precautions None      Restrictions   Weight Bearing Restrictions No      Balance Screen   Has the patient fallen in the past 6 months No    Has the patient  had a decrease in activity level because of a fear of falling?  No    Is the patient reluctant to leave their home because of a fear of falling?  No      Home Nurse, mental health Private residence    Living Arrangements Spouse/significant other;Children    Type of Home House    Home Access Stairs to enter    Entrance Stairs-Number of Steps 1    Home Layout One level      Prior Function   Level of Independence Independent    Vocation Retired    Gaffer formerly an OT    Leisure take dtr out qod, cooking, reading, gardening, games on phone      Cognition   Overall Cognitive Status Within Functional Limits for tasks assessed      Observation/Other Assessments   Focus on Therapeutic Outcomes (FOTO)  Lumbar = 63, predicted D/C FS = 70      ROM / Strength   AROM / PROM / Strength AROM;Strength      AROM   Overall AROM  Within functional limits for tasks performed    AROM Assessment Site Lumbar      Strength   Overall Strength Comments increased pain in L hip with bracing for MMT resistance of R hip  extension & abduction    Strength Assessment Site Hip;Knee;Ankle    Right/Left Hip Right;Left    Right Hip Flexion 4/5    Right Hip Extension 4-/5    Right Hip External Rotation  4/5    Right Hip Internal Rotation 4+/5    Right Hip ABduction 4/5    Right Hip ADduction 4/5    Left Hip Flexion 4-/5    Left Hip Extension 4-/5    Left Hip External Rotation 4/5    Left Hip Internal Rotation 4+/5   painful   Left Hip ABduction 4-/5    Left Hip ADduction 4-/5    Right/Left Knee Right;Left    Right Knee Flexion 5/5    Right Knee Extension 5/5    Left Knee Flexion 5/5    Left Knee Extension 5/5    Right/Left Ankle Right;Left    Right Ankle Dorsiflexion 5/5    Left Ankle Dorsiflexion 5/5      Flexibility   Soft Tissue Assessment /Muscle Length yes    Hamstrings mild/mod tight B    Quadriceps mild tight B    ITB mod tight L>R    Piriformis mod tight L>R      Palpation   Palpation comment TTP over L SIJ, lower lumbar paraspinals, medial glutes, & L greater trochanter                        Objective measurements completed on examination: See above findings.       [facility] Adult PT Treatment/Exercise - 11/21/20 1530       Lumbar Exercises: Stretches   Passive Hamstring Stretch Left;1 rep;30 seconds    Passive Hamstring Stretch Limitations hooklying with strap    ITB Stretch Left;1 rep;30 seconds    ITB Stretch Limitations supine crossbody with strap    Piriformis Stretch Left;1 rep;30 seconds    Piriformis Stretch Limitations hooklying KTOS    Figure 4 Stretch 1 rep;30 seconds;Supine;With overpressure                     PT Education - 11/21/20 1618     Education Details  PT eval findings, anticipated POC & initial HEP - Access Code: [email or web address]    Person(s) Educated Patient    Methods Explanation;Demonstration;Verbal cues;Handout    Comprehension Verbalized understanding;Verbal cues required;Returned demonstration;Need further instruction               PT Short Term Goals - 11/21/20 1620       PT SHORT TERM GOAL #1   Title Patient will be independent with initial HEP    Status New    Target Date 12/12/20      PT SHORT TERM GOAL #2   Title Patient will verbalize/demonstrate understanding of neutral spine posture and proper body mechanics to reduce strain on lumbar spine    Status New    Target Date 12/12/20               PT Long Term Goals - 11/21/20 1620       PT LONG TERM GOAL #1   Title Patient will be independent with ongoing/advanced HEP for self-management at home in order to build upon functional gains in therapy    Status New    Target Date 01/02/21      PT LONG TERM GOAL #2   Title Patient to report reduction in frequency and intensity of LBP and L hip pain by >/= 50-75% to allow for improved activity tolerance    Status New    Target Date 01/02/21      PT LONG TERM GOAL #3   Title Patient to improve tissue quality as noted by reduced tissue tightness and tenderness to palpation    Status New    Target Date 01/02/21      PT LONG TERM GOAL #4   Title Patient will demonstrate improved B proximal LE strength to >/= 4+/5 for improved stability and ease of mobility    Status New    Target Date 01/02/21      PT LONG TERM GOAL #5   Title Patient to report ability to perform ADLs including LB dressing, household tasks, and leisure activities without limitation due to LBP, L hip pain or weakness    Status New    Target Date 01/02/21                    Plan - 11/21/20 1620     Clinical Impression Statement Brooks is a 75 y/o female who presents to OP PT for acute on chronic L sided LBP, SIJ and hip pain originating in January 2022 w/o known MOI. Pt reports long h/o intermittent sciatica as well as hip subluxation issues originating in her childhood. Current pain typically worst upon rising and attempting to get dressed in the morning but also aggravated by repetitive activities such as  vacuuming. Lumbar ROM essentially WFL w/o pain provocation, however deficits noted in proximal LE flexibility and strength as well as increased muscle tension and TTP in lower lumbar paraspinals, SIJ, medial glutes and over L greater trochanter potentially indicative of greater trochanteric bursitis. Dorcas will benefit from skilled PT to address above deficits and improve proximal LE flexibility and strength with decreased pain to increase tolerance for mobility and daily activities for improved QOL.    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, trigger finger release    Examination-Activity Limitations Bathing;Dressing;Bend;Lift;Stand;Locomotion Level;Stairs;Squat;Transfers;Caring for Others    Examination-Participation Restrictions  Cleaning;Community Activity    Stability/Clinical Decision Making Stable/Uncomplicated    Clinical Decision Making Low    Rehab Potential Good    PT Frequency 2x / week    PT Duration 6 weeks    PT Treatment/Interventions ADLs/Self Care Home Management;Cryotherapy;Electrical Stimulation;Iontophoresis 4mg /ml Dexamethasone;Moist Heat;Ultrasound;Gait training;Stair training;Functional mobility training;Therapeutic activities;Therapeutic exercise;Balance training;Neuromuscular re-education;Patient/family education;Manual techniques;Passive range of motion;Dry needling;Taping;Spinal Manipulations;Joint Manipulations    PT Next Visit Plan Review initial HEP, posture & body mechanics education, progress lumbopelvic flexibilty; initiate core/lumbopelvic strengthening and stabilization; manual therapy to address increased muscle tension; modalities PRN including possible ionto patch for L greater trochanter    PT Home Exercise Plan Access Code: [email or web address]  (10/18)    Consulted and Agree with Plan of Care Patient             Patient will benefit from skilled therapeutic intervention in order to improve the following deficits and impairments:  Abnormal gait, Decreased activity tolerance, Decreased balance, Decreased endurance, Decreased mobility, Impaired flexibility, Decreased strength, Difficulty walking, Increased fascial restricitons, Increased muscle spasms, Impaired perceived functional ability, Improper body mechanics, Postural dysfunction, Pain  Visit Diagnosis: Chronic left-sided low back pain without sciatica  Pain in left hip  Other muscle spasm  Muscle weakness (generalized)  Other symptoms and signs involving the musculoskeletal system     Problem List There are no problems to display for this patient.   10-19-1993, PT 11/21/2020, 5:52 PM  [facility] [address]  [address] [place], [place], [place] Phone: [phone]   Fax:  [phone]  Name: Dorlene Footman MRN: Roddie Mc Date of Birth: Jan 01, 1946

## 2020-11-23 ENCOUNTER — Encounter: Payer: Self-pay | Admitting: Physical Therapy

## 2020-11-23 ENCOUNTER — Other Ambulatory Visit: Payer: Self-pay

## 2020-11-23 ENCOUNTER — Ambulatory Visit: Payer: Medicare Other | Admitting: Physical Therapy

## 2020-11-23 DIAGNOSIS — G8929 Other chronic pain: Secondary | ICD-10-CM

## 2020-11-23 DIAGNOSIS — M62838 Other muscle spasm: Secondary | ICD-10-CM

## 2020-11-23 DIAGNOSIS — M6281 Muscle weakness (generalized): Secondary | ICD-10-CM

## 2020-11-23 DIAGNOSIS — R29898 Other symptoms and signs involving the musculoskeletal system: Secondary | ICD-10-CM

## 2020-11-23 DIAGNOSIS — M25552 Pain in left hip: Secondary | ICD-10-CM

## 2020-11-23 DIAGNOSIS — M545 Low back pain, unspecified: Secondary | ICD-10-CM | POA: Diagnosis not present

## 2020-11-23 NOTE — Patient Instructions (Signed)
Access Code: [email or web address] URL: [email or web address] Date: 11/23/2020 Prepared by: Glenetta Hew  Exercises Hooklying Hamstring Stretch with Strap - 2-3 x daily - 7 x weekly - 3 reps - 30 sec hold Supine ITB Stretch with Strap - 2-3 x daily - 7 x weekly - 3 reps - 30 sec hold Supine Piriformis Stretch with Foot on Ground - 2-3 x daily - 7 x weekly - 3 reps - 30 sec hold Supine Figure 4 Piriformis Stretch - 2-3 x daily - 7 x weekly - 3 reps - 30 sec hold Supine Hip Adduction Isometric with Ball - 2 x daily - 7 x weekly - 2 sets - 10 reps - 5 sec hold Hooklying Isometric Clamshell - 1 x daily - 7 x weekly - 2 sets - 10 reps - 3 sec hold Supine Bridge with Resistance Band - 1 x daily - 7 x weekly - 2 sets - 10 reps - 5 sec hold  Patient Education Posture and Body Mechanics  Sleeping on Back  Place pillow under knees. A pillow with cervical support and a roll around waist are also helpful. Copyright  VHI. All rights reserved.  Sleeping on Side Place pillow between knees. Use cervical support under neck and a roll around waist as needed. Copyright  VHI. All rights reserved.   Sleeping on Stomach   If this is the only desirable sleeping position, place pillow under lower legs, and under stomach or chest as needed.  Posture - Sitting   Sit upright, head facing forward. Try using a roll to support lower back. Keep shoulders relaxed, and avoid rounded back. Keep hips level with knees. Avoid crossing legs for long periods. Stand to Sit / Sit to Stand   To sit: Bend knees to lower self onto front edge of chair, then scoot back on seat. To stand: Reverse sequence by placing one foot forward, and scoot to front of seat. Use rocking motion to stand up.   Work Height and Reach  Ideal work height is no more than 2 to 4 inches below elbow level when standing, and at elbow level when sitting. Reaching should be limited to arm's length, with elbows slightly  bent.  Bending  Bend at hips and knees, not back. Keep feet shoulder-width apart.    Posture - Standing   Good posture is important. Avoid slouching and forward head thrust. Maintain curve in low back and align ears over shoul- ders, hips over ankles.  Alternating Positions   Alternate tasks and change positions frequently to reduce fatigue and muscle tension. Take rest breaks. Computer Work   Position work to Art gallery manager. Use proper work and seat height. Keep shoulders back and down, wrists straight, and elbows at right angles. Use chair that provides full back support. Add footrest and lumbar roll as needed.  Getting Into / Out of Car  Lower self onto seat, scoot back, then bring in one leg at a time. Reverse sequence to get out.  Dressing  Lie on back to pull socks or slacks over feet, or sit and bend leg while keeping back straight.    Housework - Sink  Place one foot on ledge of cabinet under sink when standing at sink for prolonged periods.   Pushing / Pulling  Pushing is preferable to pulling. Keep back in proper alignment, and use leg muscles to do the work.  Deep Squat   Squat and lift with both arms held against upper trunk. Tighten stomach muscles without  holding breath. Use smooth movements to avoid jerking.  Avoid Twisting   Avoid twisting or bending back. Pivot around using foot movements, and bend at knees if needed when reaching for articles.  Carrying Luggage   Distribute weight evenly on both sides. Use a cart whenever possible. Do not twist trunk. Move body as a unit.   Lifting Principles Maintain proper posture and head alignment. Slide object as close as possible before lifting. Move obstacles out of the way. Test before lifting; ask for help if too heavy. Tighten stomach muscles without holding breath. Use smooth movements; do not jerk. Use legs to do the work, and pivot with feet. Distribute the work load symmetrically and close  to the center of trunk. Push instead of pull whenever possible.   Ask For Help   Ask for help and delegate to others when possible. Coordinate your movements when lifting together, and maintain the low back curve.  Log Roll   Lying on back, bend left knee and place left arm across chest. Roll all in one movement to the right. Reverse to roll to the left. Always move as one unit. Housework - Sweeping  Use long-handled equipment to avoid stooping.   Housework - Wiping  Position yourself as close as possible to reach work surface. Avoid straining your back.  Laundry - Unloading Wash   To unload small items at bottom of washer, lift leg opposite to arm being used to reach.  Gardening - Raking  Move close to area to be raked. Use arm movements to do the work. Keep back straight and avoid twisting.     Cart  When reaching into cart with one arm, lift opposite leg to keep back straight.   Getting Into / Out of Bed  Lower self to lie down on one side by raising legs and lowering head at the same time. Use arms to assist moving without twisting. Bend both knees to roll onto back if desired. To sit up, start from lying on side, and use same move-ments in reverse. Housework - Vacuuming  Hold the vacuum with arm held at side. Step back and forth to move it, keeping head up. Avoid twisting.   Laundry - Armed forces training and education officer so that bending and twisting can be avoided.   Laundry - Unloading Dryer  Squat down to reach into clothes dryer or use a reacher.  Gardening - Weeding / Psychiatric nurse or Kneel. Knee pads may be helpful.

## 2020-11-23 NOTE — Therapy (Addendum)
[facility] Outpatient Rehabilitation [facility] [address]  [address] [place], [place], [address] Phone: [phone]   Fax:  [phone]  Physical Therapy Treatment  Patient Details  Name: Charliene Inoue MRN: [id number] Date of Birth: [birth year] Referring Provider (PT): Ollen Gross, MD   Encounter Date: 11/23/2020   PT End of Session - 11/23/20 1534     Visit Number 2    Number of Visits 12    Date for PT Re-Evaluation 01/02/21    Authorization Type Medicare, BCBS, Tricare for Life    PT Start Time 1534    PT Stop Time 1623    PT Time Calculation (min) 49 min    Activity Tolerance Patient tolerated treatment well    Behavior During Therapy [facility] for tasks assessed/performed;Impulsive             Past Medical History:  Diagnosis Date   Back pain    Diabetes mellitus without complication (HCC)    Gas    Hypertension    Panic attack    Post-menopause     Past Surgical History:  Procedure Laterality Date   neck nerve block     TRIGGER FINGER RELEASE      There were no vitals filed for this visit.   Subjective Assessment - 11/23/20 1539     Subjective Pt reports she felt more of a stretch in her HS than her buttocks with the HEP stretches.    Patient Stated Goals "to be able to get up in the morning and stretch out so I can get dressed easier and garden w/o pain"    Currently in Pain? No/denies    Pain Onset More than a month ago   exacerbation in January 2022                              [facility] Adult PT Treatment/Exercise - 11/23/20 1534       Self-Care   Self-Care Posture    Posture Provided education in proper posture and body mechanics for typical daily tasks and ADLs      Exercises   Exercises Lumbar      Lumbar Exercises: Stretches   Passive Hamstring Stretch Left;1 rep;30 seconds    Passive Hamstring Stretch Limitations hooklying with strap    ITB Stretch Left;1 rep;30 seconds    ITB Stretch Limitations  supine crossbody with strap    Piriformis Stretch Left;1 rep;30 seconds    Piriformis Stretch Limitations hooklying KTOS    Figure 4 Stretch 1 rep;30 seconds;Supine;With overpressure      Lumbar Exercises: Aerobic   Nustep L3 x 6 min (UE/LE)      Lumbar Exercises: Supine   Ab Set 10 reps;5 seconds    Clam 10 reps;3 seconds    Clam Limitations TrA + red TB alt hip ABD/ER    Bridge 10 reps;5 seconds    Bridge Limitations + red TB hip ABD isometric    Other Supine Lumbar Exercises TrA + hip ADD ball squeeze 10 x 5"                     PT Education - 11/23/20 1621     Education Details HEP review & update - lumbopelvic strengthening - Access Code: [email or web address]; Hospital doctor education for typical daily tasks    Person(s) Educated Patient    Methods Explanation;Demonstration;Verbal cues;Tactile cues;Handout    Comprehension Verbalized understanding;Verbal  cues required;Tactile cues required;Returned demonstration;Need further instruction              PT Short Term Goals - 11/23/20 1542       PT SHORT TERM GOAL #1   Title Patient will be independent with initial HEP    Status On-going    Target Date 12/12/20      PT SHORT TERM GOAL #2   Title Patient will verbalize/demonstrate understanding of neutral spine posture and proper body mechanics to reduce strain on lumbar spine    Status On-going    Target Date 12/12/20               PT Long Term Goals - 11/23/20 1542       PT LONG TERM GOAL #1   Title Patient will be independent with ongoing/advanced HEP for self-management at home in order to build upon functional gains in therapy    Status On-going    Target Date 01/02/21      PT LONG TERM GOAL #2   Title Patient to report reduction in frequency and intensity of LBP and L hip pain by >/= 50-75% to allow for improved activity tolerance    Status On-going    Target Date 01/02/21      PT LONG TERM GOAL #3   Title Patient to improve tissue  quality as noted by reduced tissue tightness and tenderness to palpation    Status On-going    Target Date 01/02/21      PT LONG TERM GOAL #4   Title Patient will demonstrate improved B proximal LE strength to >/= 4+/5 for improved stability and ease of mobility    Status On-going    Target Date 01/02/21      PT LONG TERM GOAL #5   Title Patient to report ability to perform ADLs including LB dressing, household tasks, and leisure activities without limitation due to LBP, L hip pain or weakness    Status On-going    Target Date 01/02/21                   Plan - 11/23/20 1545     Clinical Impression Statement Karlee reports she felt more of HS stretch than anything with the HEP. HEP reviewed and cues provided to correct positioning and angle of pull during ITB stretch to avoid pulling into HS stretch. Introduced basic lumbopelvic strengthening targeting TrA activation and controlled hip motion as pt tends to move rather quickly/impulsively unless cued to slow pace and focus on muscle activation - HEP updated to include some strengthening exercises. Education provided for proper posture and body mechanics to reduce lumbar strain with daily tasks with handout provided.    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, trigger finger release    Rehab Potential Good    PT Frequency 2x / week    PT Duration 6 weeks    PT Treatment/Interventions ADLs/Self Care Home Management;Cryotherapy;Electrical Stimulation;Iontophoresis 4mg /ml Dexamethasone;Moist Heat;Ultrasound;Gait training;Stair training;Functional mobility training;Therapeutic activities;Therapeutic exercise;Balance training;Neuromuscular re-education;Patient/family education;Manual techniques;Passive range of motion;Dry needling;Taping;Spinal Manipulations;Joint Manipulations    PT Next Visit Plan Review initial HEP, posture & body mechanics  education, progress lumbopelvic flexibilty; initiate core/lumbopelvic strengthening and stabilization; manual therapy to address increased muscle tension; modalities PRN including possible ionto patch for L greater trochanter    PT Home Exercise Plan Access Code: [email or web address] (10/18, updated 10/20)    Consulted and Agree with  Plan of Care Patient             Patient will benefit from skilled therapeutic intervention in order to improve the following deficits and impairments:  Abnormal gait, Decreased activity tolerance, Decreased balance, Decreased endurance, Decreased mobility, Impaired flexibility, Decreased strength, Difficulty walking, Increased fascial restricitons, Increased muscle spasms, Impaired perceived functional ability, Improper body mechanics, Postural dysfunction, Pain  Visit Diagnosis: Chronic left-sided low back pain without sciatica  Pain in left hip  Other muscle spasm  Muscle weakness (generalized)  Other symptoms and signs involving the musculoskeletal system     Problem List There are no problems to display for this patient.   Marry Guan, PT 11/23/2020, 6:20 PM  [facility] [address]  [address] [place], [place], [address] Phone: [phone]   Fax:  [phone]  Name: Jennie Hannay MRN: [id number] Date of Birth: 1945/02/28

## 2020-11-27 ENCOUNTER — Other Ambulatory Visit: Payer: Self-pay

## 2020-11-27 ENCOUNTER — Encounter: Payer: Self-pay | Admitting: Physical Therapy

## 2020-11-27 ENCOUNTER — Ambulatory Visit: Payer: Medicare Other | Admitting: Physical Therapy

## 2020-11-27 DIAGNOSIS — M6281 Muscle weakness (generalized): Secondary | ICD-10-CM

## 2020-11-27 DIAGNOSIS — M62838 Other muscle spasm: Secondary | ICD-10-CM

## 2020-11-27 DIAGNOSIS — M545 Low back pain, unspecified: Secondary | ICD-10-CM

## 2020-11-27 DIAGNOSIS — G8929 Other chronic pain: Secondary | ICD-10-CM

## 2020-11-27 DIAGNOSIS — R29898 Other symptoms and signs involving the musculoskeletal system: Secondary | ICD-10-CM

## 2020-11-27 DIAGNOSIS — M25552 Pain in left hip: Secondary | ICD-10-CM

## 2020-11-27 NOTE — Therapy (Signed)
[facility] [address]  [address] [place], [place], [address] Phone: [phone]   Fax:  [phone]  Physical Therapy Treatment  Patient Details  Name: Cassie Wilson MRN: [id number] Date of Birth: [birth year] Referring Provider (PT): Ollen Gross, MD   Encounter Date: 11/27/2020   PT End of Session - 11/27/20 1528     Visit Number 3    Number of Visits 12    Date for PT Re-Evaluation 01/02/21    Authorization Type Medicare, BCBS, Tricare for Life    PT Start Time 1528    PT Stop Time 1610    PT Time Calculation (min) 42 min    Activity Tolerance Patient tolerated treatment well    Behavior During Therapy [facility] for tasks assessed/performed;Impulsive             Past Medical History:  Diagnosis Date   Back pain    Diabetes mellitus without complication (HCC)    Gas    Hypertension    Panic attack    Post-menopause     Past Surgical History:  Procedure Laterality Date   neck nerve block     TRIGGER FINGER RELEASE      There were no vitals filed for this visit.   Subjective Assessment - 11/27/20 1532     Subjective Pt reports a "catch" in her R side under her ribs this morning which has now resolved - she states she held off on the bridges today in case that was what has triggered the "catch".    Patient Stated Goals "to be able to get up in the morning and stretch out so I can get dressed easier and garden w/o pain"    Currently in Pain? No/denies    Pain Onset More than a month ago   exacerbation in January 2022                              [facility] Adult PT Treatment/Exercise - 11/27/20 1528       Exercises   Exercises Lumbar      Lumbar Exercises: Stretches   Other Lumbar Stretch Exercise 3-way seated prayer stretch with green Pball 2 x 30 sec      Lumbar Exercises: Aerobic   Nustep L4 x 6 min (UE/LE)      Lumbar Exercises: Standing   Heel Raises 20 reps;3 seconds    Heel  Raises Limitations cues for abd bracing + quad & glute set    Functional Squats 10 reps;3 seconds    Functional Squats Limitations counter squat with cues for posture & posterior weight shift avoiding knees fwd of toes    Row Both;10 reps;Strengthening;Theraband    Theraband Level (Row) Level 2 (Red)    Row Limitations cues for scap retraction & abd bracing with slight staggered stance    Shoulder Extension Both;10 reps;Strengthening;Theraband    Theraband Level (Shoulder Extension) Level 2 (Red)    Shoulder Extension Limitations cues for scap retraction & abd bracing with slight staggered stance    Other Standing Lumbar Exercises B red TB pallof press x 10 each direction      Lumbar Exercises: Supine   Bent Knee Raise 10 reps;3 seconds    Bent Knee Raise Limitations TrA + red TB march    Dead Bug 10 reps;3 seconds   2 sets   Dead Bug Limitations 1st set - LE only; 2nd set -  opp UE/LE    Bridge 10 reps;5 seconds    Bridge Limitations + red TB hip ABD isometric                       PT Short Term Goals - 11/23/20 1542       PT SHORT TERM GOAL #1   Title Patient will be independent with initial HEP    Status On-going    Target Date 12/12/20      PT SHORT TERM GOAL #2   Title Patient will verbalize/demonstrate understanding of neutral spine posture and proper body mechanics to reduce strain on lumbar spine    Status On-going    Target Date 12/12/20               PT Long Term Goals - 11/23/20 1542       PT LONG TERM GOAL #1   Title Patient will be independent with ongoing/advanced HEP for self-management at home in order to build upon functional gains in therapy    Status On-going    Target Date 01/02/21      PT LONG TERM GOAL #2   Title Patient to report reduction in frequency and intensity of LBP and L hip pain by >/= 50-75% to allow for improved activity tolerance    Status On-going    Target Date 01/02/21      PT LONG TERM GOAL #3   Title Patient to  improve tissue quality as noted by reduced tissue tightness and tenderness to palpation    Status On-going    Target Date 01/02/21      PT LONG TERM GOAL #4   Title Patient will demonstrate improved B proximal LE strength to >/= 4+/5 for improved stability and ease of mobility    Status On-going    Target Date 01/02/21      PT LONG TERM GOAL #5   Title Patient to report ability to perform ADLs including LB dressing, household tasks, and leisure activities without limitation due to LBP, L hip pain or weakness    Status On-going    Target Date 01/02/21                   Plan - 11/27/20 1535     Clinical Impression Statement Cassie Wilson denies any questions or concerns regarding the posture and body mechanics education provided last session. HEP going well other than slight "catch" noted in R upper abdominal/lower ribs which she thinks may have been triggered by the bridge exercise - reviewed bridges with good technique demonstrated and no reproduction of her pain. Progressed lumbopelvic strengthening with further progression of supine exercises as well as introduction of standing exercises targeting abdominal activation for lumbar support. All exercises well tolerated other than slight stretching sensation noted in L shoulder with dead bug related to h/o prior fracture and cues necessary to avoid shoulder shrugs during rows/retraction to avoid cramping in UTs.    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, trigger finger release    Rehab Potential Good    PT Frequency 2x / week    PT Duration 6 weeks    PT Treatment/Interventions ADLs/Self Care Home Management;Cryotherapy;Electrical Stimulation;Iontophoresis 4mg /ml Dexamethasone;Moist Heat;Ultrasound;Gait training;Stair training;Functional mobility training;Therapeutic activities;Therapeutic exercise;Balance training;Neuromuscular  re-education;Patient/family education;Manual techniques;Passive range of motion;Dry needling;Taping;Spinal Manipulations;Joint Manipulations    PT Next Visit Plan progress lumbopelvic flexibilty; initiate core/lumbopelvic strengthening and stabilization; manual  therapy to address increased muscle tension; modalities PRN including possible ionto patch for L greater trochanter, review HEP and posture & body mechanics education PRN    PT Home Exercise Plan Access Code: [email or web address] (10/18, updated 10/20)    Consulted and Agree with Plan of Care Patient             Patient will benefit from skilled therapeutic intervention in order to improve the following deficits and impairments:  Abnormal gait, Decreased activity tolerance, Decreased balance, Decreased endurance, Decreased mobility, Impaired flexibility, Decreased strength, Difficulty walking, Increased fascial restricitons, Increased muscle spasms, Impaired perceived functional ability, Improper body mechanics, Postural dysfunction, Pain  Visit Diagnosis: Chronic left-sided low back pain without sciatica  Pain in left hip  Other muscle spasm  Muscle weakness (generalized)  Other symptoms and signs involving the musculoskeletal system     Problem List There are no problems to display for this patient.   Marry Guan, PT 11/27/2020, 8:36 PM  [facility] [address]  [address] [place], [place], [address] Phone: [phone]   Fax:  [phone]  Name: Cassie Wilson MRN: [id number] Date of Birth: [birth year]

## 2020-11-30 ENCOUNTER — Other Ambulatory Visit: Payer: Self-pay

## 2020-11-30 ENCOUNTER — Encounter: Payer: Self-pay | Admitting: Physical Therapy

## 2020-11-30 ENCOUNTER — Ambulatory Visit: Payer: Medicare Other | Admitting: Physical Therapy

## 2020-11-30 DIAGNOSIS — R29898 Other symptoms and signs involving the musculoskeletal system: Secondary | ICD-10-CM

## 2020-11-30 DIAGNOSIS — M6281 Muscle weakness (generalized): Secondary | ICD-10-CM

## 2020-11-30 DIAGNOSIS — M62838 Other muscle spasm: Secondary | ICD-10-CM

## 2020-11-30 DIAGNOSIS — M545 Low back pain, unspecified: Secondary | ICD-10-CM | POA: Diagnosis not present

## 2020-11-30 DIAGNOSIS — M25552 Pain in left hip: Secondary | ICD-10-CM

## 2020-11-30 DIAGNOSIS — G8929 Other chronic pain: Secondary | ICD-10-CM

## 2020-11-30 NOTE — Therapy (Signed)
[facility] [address]  [place], [place], [address] Phone: [phone]   Fax:  [phone]  Physical Therapy Treatment  Patient Details  Name: Okema Rollinson MRN: [id number] Date of Birth: 03/05/1945 Referring Provider (PT): Gaynelle Arabian, MD   Encounter Date: 11/30/2020   PT End of Session - 11/30/20 1534     Visit Number 4    Number of Visits 12    Date for PT Re-Evaluation 01/02/21    Authorization Type Medicare, BCBS, Tricare for Life    PT Start Time 1534    PT Stop Time 1621    PT Time Calculation (min) 47 min    Activity Tolerance Patient tolerated treatment well    Behavior During Therapy [facility] for tasks assessed/performed             Past Medical History:  Diagnosis Date   Back pain    Diabetes mellitus without complication ([place])    Gas    Hypertension    Panic attack    Post-menopause     Past Surgical History:  Procedure Laterality Date   neck nerve block     TRIGGER FINGER RELEASE      There were no vitals filed for this visit.   Subjective Assessment - 11/30/20 1538     Subjective Pt reports intermittent pain in her L buttocks with some activities today - worst when she crosses her L leg over her R.    Patient Stated Goals "to be able to get up in the morning and stretch out so I can get dressed easier and garden w/o pain"    Currently in Pain? Yes    Pain Score 2    with some activities   Pain Location Buttocks    Pain Orientation Left    Pain Descriptors / Indicators Aching    Pain Type Chronic pain    Pain Onset More than a month ago   exacerbation in January 2022   Pain Frequency Intermittent                               [facility] Adult PT Treatment/Exercise - 11/30/20 1534       Exercises   Exercises Lumbar      Lumbar Exercises: Stretches   Quadruped Mid Back Stretch 3 reps;30 seconds   2 sets   Quadruped Mid Back Stretch Limitations 3-way prayer  stretch/child's pose      Lumbar Exercises: Aerobic   UBE (Upper Arm Bike) L1.0 x 6 (3' each fwd/back)      Lumbar Exercises: Standing   Heel Raises 20 reps;3 seconds    Heel Raises Limitations cues for abd bracing + quad & glute set - avoiding pushing belly forward    Functional Squats 10 reps;3 seconds    Functional Squats Limitations counter squat with cues for posture & posterior weight shift avoiding knees fwd of toes      Lumbar Exercises: Sidelying   Clam Right;Left;10 reps;3 seconds    Clam Limitations red TB      Lumbar Exercises: Quadruped   Madcat/Old Horse 10 reps      Manual Therapy   Manual Therapy Soft tissue mobilization;Myofascial release;Other (comment)    Soft tissue mobilization STM/DTM to L glutes and piriformis    Myofascial Release manual TPR to L medial glute medius/minimus    Other Manual Therapy Instructed pt in self-STM to glutes  using ball on wall                     PT Education - 11/30/20 1618     Education Details Role of DN to normalize muscle tension    Person(s) Educated Patient    Methods Explanation;Handout    Comprehension Verbalized understanding              PT Short Term Goals - 11/30/20 1548       PT SHORT TERM GOAL #1   Title Patient will be independent with initial HEP    Status Achieved   11/30/20     PT SHORT TERM GOAL #2   Title Patient will verbalize/demonstrate understanding of neutral spine posture and proper body mechanics to reduce strain on lumbar spine    Status Achieved   11/30/20              PT Long Term Goals - 11/23/20 1542       PT LONG TERM GOAL #1   Title Patient will be independent with ongoing/advanced HEP for self-management at home in order to build upon functional gains in therapy    Status On-going    Target Date 01/02/21      PT LONG TERM GOAL #2   Title Patient to report reduction in frequency and intensity of LBP and L hip pain by >/= 50-75% to allow for improved activity  tolerance    Status On-going    Target Date 01/02/21      PT LONG TERM GOAL #3   Title Patient to improve tissue quality as noted by reduced tissue tightness and tenderness to palpation    Status On-going    Target Date 01/02/21      PT LONG TERM GOAL #4   Title Patient will demonstrate improved B proximal LE strength to >/= 4+/5 for improved stability and ease of mobility    Status On-going    Target Date 01/02/21      PT LONG TERM GOAL #5   Title Patient to report ability to perform ADLs including LB dressing, household tasks, and leisure activities without limitation due to LBP, L hip pain or weakness    Status On-going    Target Date 01/02/21                   Plan - 11/30/20 1621     Clinical Impression Statement Skye denies any issues or concerns with her initial HEP or the posture and body mechanics information provided - STGs met. She notes ongoing soreness in L buttock with TTP and increased muscle tension noted in medial glute minimus and medius - addressed with manual STM and TPR with decreased TTP and muscle tension achieved. Pt instructed in self-STM technique using ball on wall and information provided on potential use of DN in future visit(s) to address any remaining abnormal muscle tension/tenderness. Pt able to tolerate progression of strengthening exercise including introduction of quadruped position w/o increased pain.    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, trigger finger release    Rehab Potential Good    PT Frequency 2x / week    PT Duration 6 weeks    PT Treatment/Interventions ADLs/Self Care Home Management;Cryotherapy;Electrical Stimulation;Iontophoresis 20m/ml Dexamethasone;Moist Heat;Ultrasound;Gait training;Stair training;Functional mobility training;Therapeutic activities;Therapeutic exercise;Balance training;Neuromuscular  re-education;Patient/family education;Manual techniques;Passive range of motion;Dry needling;Taping;Spinal Manipulations;Joint Manipulations    PT Next Visit  Plan progress lumbopelvic flexibilty; initiate core/lumbopelvic strengthening and stabilization; manual therapy to address increased muscle tension; modalities PRN including possible ionto patch for L greater trochanter, review HEP and posture & body mechanics education PRN    PT Home Exercise Plan Access Code: [email or web address] (10/18, updated 10/20)    Consulted and Agree with Plan of Care Patient             Patient will benefit from skilled therapeutic intervention in order to improve the following deficits and impairments:  Abnormal gait, Decreased activity tolerance, Decreased balance, Decreased endurance, Decreased mobility, Impaired flexibility, Decreased strength, Difficulty walking, Increased fascial restricitons, Increased muscle spasms, Impaired perceived functional ability, Improper body mechanics, Postural dysfunction, Pain  Visit Diagnosis: Chronic left-sided low back pain without sciatica  Pain in left hip  Other muscle spasm  Muscle weakness (generalized)  Other symptoms and signs involving the musculoskeletal system     Problem List There are no problems to display for this patient.   Percival Spanish, PT 11/30/2020, 8:02 PM  [facility] [address]  [place], [place], [address] Phone: [phone]   Fax:  [phone]  Name: Anilah Huck MRN: [id number] Date of Birth: 1945/11/26

## 2020-11-30 NOTE — Patient Instructions (Signed)

## 2020-12-04 ENCOUNTER — Other Ambulatory Visit: Payer: Self-pay

## 2020-12-04 ENCOUNTER — Ambulatory Visit: Payer: Medicare Other | Admitting: Physical Therapy

## 2020-12-04 ENCOUNTER — Encounter: Payer: Self-pay | Admitting: Physical Therapy

## 2020-12-04 DIAGNOSIS — M62838 Other muscle spasm: Secondary | ICD-10-CM

## 2020-12-04 DIAGNOSIS — M6281 Muscle weakness (generalized): Secondary | ICD-10-CM

## 2020-12-04 DIAGNOSIS — G8929 Other chronic pain: Secondary | ICD-10-CM

## 2020-12-04 DIAGNOSIS — R29898 Other symptoms and signs involving the musculoskeletal system: Secondary | ICD-10-CM

## 2020-12-04 DIAGNOSIS — M545 Low back pain, unspecified: Secondary | ICD-10-CM | POA: Diagnosis not present

## 2020-12-04 DIAGNOSIS — M25552 Pain in left hip: Secondary | ICD-10-CM

## 2020-12-04 NOTE — Patient Instructions (Signed)
   Access Code: [email or web address] URL: [email or web address] Date: 12/04/2020 Prepared by: Glenetta Hew  Exercises Hooklying Hamstring Stretch with Strap - 2-3 x daily - 7 x weekly - 3 reps - 30 sec hold Supine ITB Stretch with Strap - 2-3 x daily - 7 x weekly - 3 reps - 30 sec hold Supine Piriformis Stretch with Foot on Ground - 2-3 x daily - 7 x weekly - 3 reps - 30 sec hold Supine Figure 4 Piriformis Stretch - 2-3 x daily - 7 x weekly - 3 reps - 30 sec hold Supine Hip Adduction Isometric with Ball - 2 x daily - 7 x weekly - 2 sets - 10 reps - 5 sec hold Hooklying Isometric Clamshell - 1 x daily - 7 x weekly - 2 sets - 10 reps - 3 sec hold Supine Bridge with Resistance Band - 1 x daily - 7 x weekly - 2 sets - 10 reps - 5 sec hold Supine Piriformis Stretch - 2 x daily - 7 x weekly - 3 reps - 30 sec hold Standing ITB Stretch - 2-3 x daily - 7 x weekly - 3 reps - 30 sec hold  Patient Education Hospital doctor

## 2020-12-04 NOTE — Therapy (Addendum)
[facility] [address]  [address] [place], [place], [address] Phone: [phone]   Fax:  [phone]  Physical Therapy Treatment  Patient Details  Name: Cassie Wilson MRN: [id number] Date of Birth: [birth year] Referring Provider (PT): Ollen Gross, MD   Encounter Date: 12/04/2020   PT End of Session - 12/04/20 1535     Visit Number 5    Number of Visits 12    Date for PT Re-Evaluation 01/02/21    Authorization Type Medicare, BCBS, Tricare for Life    PT Start Time 1535    PT Stop Time 1624    PT Time Calculation (min) 49 min    Activity Tolerance Patient tolerated treatment well    Behavior During Therapy [facility] for tasks assessed/performed             Past Medical History:  Diagnosis Date   Back pain    Diabetes mellitus without complication (HCC)    Gas    Hypertension    Panic attack    Post-menopause     Past Surgical History:  Procedure Laterality Date   neck nerve block     TRIGGER FINGER RELEASE      There were no vitals filed for this visit.   Subjective Assessment - 12/04/20 1537     Subjective Pt reports some pain in her lateral thighs upon waking or when she sits with her legs crossed but otherwise typically no pain except soreness after exercising which does not seem to linger.    Patient Stated Goals "to be able to get up in the morning and stretch out so I can get dressed easier and garden w/o pain"    Currently in Pain? No/denies    Pain Onset More than a month ago   exacerbation in January 2022                              [facility] Adult PT Treatment/Exercise - 12/04/20 1535       Lumbar Exercises: Stretches   Active Hamstring Stretch Right;Left;2 reps;30 seconds    Active Hamstring Stretch Limitations hooklying knee extension with hands support thigh    ITB Stretch Left;Right;2 reps;30 seconds   each   ITB Stretch Limitations standing lateral lean over counter &  back of chair - chait height preferred    Piriformis Stretch Left;2 reps;20 seconds    Piriformis Stretch Limitations seated KTOS & supine KTOS (opp LE straight)      Lumbar Exercises: Aerobic   Recumbent Bike L1 x 6 min      Lumbar Exercises: Supine   Bridge with clamshell 10 reps;5 seconds   2 sets   Bridge with [employer] Limitations red TB - 1st set with B clam, 2nd set with alt hip ABD/ER (increased effort noted on R with latter)      Lumbar Exercises: Sidelying   Clam Right;Left;10 reps;3 seconds    Clam Limitations red TB    Other Sidelying Lumbar Exercises R/L reverse clam with red TB (R only) x 10   increased L hip pain with band on L                    PT Education - 12/04/20 1624     Education Details HEP update - stretching alternatives - Access Code: [email or web address]    Person(s) Educated Patient    Methods Explanation;Demonstration;Verbal cues;Handout  Comprehension Verbalized understanding;Verbal cues required;Returned demonstration;Tactile cues required              PT Short Term Goals - 11/30/20 1548       PT SHORT TERM GOAL #1   Title Patient will be independent with initial HEP    Status Achieved   11/30/20     PT SHORT TERM GOAL #2   Title Patient will verbalize/demonstrate understanding of neutral spine posture and proper body mechanics to reduce strain on lumbar spine    Status Achieved   11/30/20              PT Long Term Goals - 11/23/20 1542       PT LONG TERM GOAL #1   Title Patient will be independent with ongoing/advanced HEP for self-management at home in order to build upon functional gains in therapy    Status On-going    Target Date 01/02/21      PT LONG TERM GOAL #2   Title Patient to report reduction in frequency and intensity of LBP and L hip pain by >/= 50-75% to allow for improved activity tolerance    Status On-going    Target Date 01/02/21      PT LONG TERM GOAL #3   Title Patient to improve tissue quality as  noted by reduced tissue tightness and tenderness to palpation    Status On-going    Target Date 01/02/21      PT LONG TERM GOAL #4   Title Patient will demonstrate improved B proximal LE strength to >/= 4+/5 for improved stability and ease of mobility    Status On-going    Target Date 01/02/21      PT LONG TERM GOAL #5   Title Patient to report ability to perform ADLs including LB dressing, household tasks, and leisure activities without limitation due to LBP, L hip pain or weakness    Status On-going    Target Date 01/02/21                   Plan - 12/04/20 1624     Clinical Impression Statement Cassie Wilson reports continued pain/discomfort greatest with crossing her legs although only in sitting (able to cross in hooklying w/o pain). Modified ITB and piriformis stretches to provide stronger stretches with good tolerance and progressed glute strengthening with bridge progression, however pt remains more limited with attempts to progress strengthening exercises in sidelying. Discussed ionto patch and taping as options to address ongoing pain, however pt noting adhesive tape sensitivity therefore deferred at this time.    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, trigger finger release    Rehab Potential Good    PT Frequency 2x / week    PT Duration 6 weeks    PT Treatment/Interventions ADLs/Self Care Home Management;Cryotherapy;Electrical Stimulation;Iontophoresis 4mg /ml Dexamethasone;Moist Heat;Ultrasound;Gait training;Stair training;Functional mobility training;Therapeutic activities;Therapeutic exercise;Balance training;Neuromuscular re-education;Patient/family education;Manual techniques;Passive range of motion;Dry needling;Taping;Spinal Manipulations;Joint Manipulations    PT Next Visit Plan progress lumbopelvic flexibilty; core/lumbopelvic strengthening and stabilization- emphasis on  glutes; manual therapy to address increased muscle tension; modalities PRN including possible ionto patch for L greater trochanter if pain not improving although only as last option as pt reporting adhesive tape sensitivity, review HEP and posture & body mechanics education PRN    PT Home Exercise Plan Access Code: [email or web address] (10/18, updated 10/20 & 10/31)    Consulted and Agree with Plan  of Care Patient             Patient will benefit from skilled therapeutic intervention in order to improve the following deficits and impairments:  Abnormal gait, Decreased activity tolerance, Decreased balance, Decreased endurance, Decreased mobility, Impaired flexibility, Decreased strength, Difficulty walking, Increased fascial restricitons, Increased muscle spasms, Impaired perceived functional ability, Improper body mechanics, Postural dysfunction, Pain  Visit Diagnosis: Chronic left-sided low back pain without sciatica  Pain in left hip  Other muscle spasm  Muscle weakness (generalized)  Other symptoms and signs involving the musculoskeletal system     Problem List There are no problems to display for this patient.   Marry Guan, PT 12/04/2020, 6:19 PM  [facility] [address]  [address] [place], [place], [address] Phone: [phone]   Fax:  [phone]  Name: Cassie Wilson MRN: [id number] Date of Birth: 06-Jun-1945

## 2020-12-07 ENCOUNTER — Other Ambulatory Visit: Payer: Self-pay

## 2020-12-07 ENCOUNTER — Encounter: Payer: Self-pay | Admitting: Physical Therapy

## 2020-12-07 ENCOUNTER — Ambulatory Visit: Payer: Medicare Other | Attending: Orthopedic Surgery | Admitting: Physical Therapy

## 2020-12-07 DIAGNOSIS — G8929 Other chronic pain: Secondary | ICD-10-CM | POA: Diagnosis present

## 2020-12-07 DIAGNOSIS — R29898 Other symptoms and signs involving the musculoskeletal system: Secondary | ICD-10-CM | POA: Diagnosis present

## 2020-12-07 DIAGNOSIS — M6281 Muscle weakness (generalized): Secondary | ICD-10-CM | POA: Insufficient documentation

## 2020-12-07 DIAGNOSIS — M25552 Pain in left hip: Secondary | ICD-10-CM | POA: Diagnosis present

## 2020-12-07 DIAGNOSIS — M62838 Other muscle spasm: Secondary | ICD-10-CM | POA: Diagnosis present

## 2020-12-07 DIAGNOSIS — M545 Low back pain, unspecified: Secondary | ICD-10-CM | POA: Insufficient documentation

## 2020-12-07 NOTE — Therapy (Signed)
[facility] [address]  [place], [place], [address] Phone: [phone]   Fax:  [phone]  Physical Therapy Treatment  Patient Details  Name: Cassie Wilson MRN: [id number] Date of Birth: 31-Mar-1945 Referring Provider (PT): Gaynelle Arabian, MD   Encounter Date: 12/07/2020   PT End of Session - 12/07/20 1532     Visit Number 6    Number of Visits 12    Date for PT Re-Evaluation 01/02/21    Authorization Type Medicare, BCBS, Tricare for Life    PT Start Time 1532    PT Stop Time 1624    PT Time Calculation (min) 52 min    Activity Tolerance Patient tolerated treatment well    Behavior During Therapy [facility] for tasks assessed/performed             Past Medical History:  Diagnosis Date   Back pain    Diabetes mellitus without complication ([place])    Gas    Hypertension    Panic attack    Post-menopause     Past Surgical History:  Procedure Laterality Date   neck nerve block     TRIGGER FINGER RELEASE      There were no vitals filed for this visit.   Subjective Assessment - 12/07/20 1537     Subjective Pt continues to feel like she has knots in her muscles whcih causes her to have difficulty with things like lifting her L leg up to position it on the bike for warm-up. Some tightness/discomfort noted at distal posterior thigh initially during warmup, but seemed to subside as she got moving.    Patient Stated Goals "to be able to get up in the morning and stretch out so I can get dressed easier and garden w/o pain"    Currently in Pain? No/denies    Pain Onset More than a month ago   exacerbation in January 2022                              [facility] Adult PT Treatment/Exercise - 12/07/20 1532       Exercises   Exercises Lumbar      Lumbar Exercises: Stretches   Piriformis Stretch Left;2 reps;20 seconds    Piriformis Stretch Limitations seated KTOS    Figure 4 Stretch 2 reps;30  seconds;Seated;With overpressure    Figure 4 Stretch Limitations side sitting hip hinge    Other Lumbar Stretch Exercise Hesch stretch for L Anterior Ilium Fixation - thomas position over end of mat table with L knee flexed and foot on edge of table with hip slightly ABD/ER x 2 min      Lumbar Exercises: Aerobic   Recumbent Bike L2 x 6 min      Lumbar Exercises: Supine   Bridge 10 reps;5 seconds    Bridge Limitations + red TB hip ABD isometric    Bridge with clamshell 10 reps;5 seconds    Bridge with [employer] Limitations red TB - B clam      Manual Therapy   Manual Therapy Muscle Energy Technique;Joint mobilization    Joint Mobilization Sustained supine manual posterior rotation of ilium x 2 min with posterior rotation mobs 2 x 10    Muscle Energy Technique MET to correct apparent L anterior rotation of pelvis on sacrum followed by pelvic shotgun - audible cavitation noted at pubic symphsis pelvic shotgun with pt reporting reduction in glute/buttock  following MET                       PT Short Term Goals - 11/30/20 1548       PT SHORT TERM GOAL #1   Title Patient will be independent with initial HEP    Status Achieved   11/30/20     PT SHORT TERM GOAL #2   Title Patient will verbalize/demonstrate understanding of neutral spine posture and proper body mechanics to reduce strain on lumbar spine    Status Achieved   11/30/20              PT Long Term Goals - 11/23/20 1542       PT LONG TERM GOAL #1   Title Patient will be independent with ongoing/advanced HEP for self-management at home in order to build upon functional gains in therapy    Status On-going    Target Date 01/02/21      PT LONG TERM GOAL #2   Title Patient to report reduction in frequency and intensity of LBP and L hip pain by >/= 50-75% to allow for improved activity tolerance    Status On-going    Target Date 01/02/21      PT LONG TERM GOAL #3   Title Patient to improve tissue quality  as noted by reduced tissue tightness and tenderness to palpation    Status On-going    Target Date 01/02/21      PT LONG TERM GOAL #4   Title Patient will demonstrate improved B proximal LE strength to >/= 4+/5 for improved stability and ease of mobility    Status On-going    Target Date 01/02/21      PT LONG TERM GOAL #5   Title Patient to report ability to perform ADLs including LB dressing, household tasks, and leisure activities without limitation due to LBP, L hip pain or weakness    Status On-going    Target Date 01/02/21                   Plan - 12/07/20 1857     Clinical Impression Statement Cassie Wilson reports ongoing muscle tightness/knots in her glutes, ITB and lateral quads/HS which "seem to move around". She is able to get some relief with self-STM techniques but then the pain seem to move to a new location. We discussed option to try DN to promote more lasting reduction in muscle tension but pt reluctant to try this today as she plans to go grocery shopping following PT and is afraid DN may make her too sore to go. She notes more pain localized to her L SIJ today with assessment revealing hypomobility on L with apparent anterior rotation of her ilium on her sacrum. Utilized MET and mobs to correct SIJ alignment to neutral followed by positional stretching and strengthening to promote improved lumbopelvic stability with pt noting improvement in pain. Will plan to reassess SIJ next visit and will consider DN as muscle tension/TPs indicate.    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, trigger finger release    Rehab Potential Good    PT Frequency 2x / week    PT Duration 6 weeks    PT Treatment/Interventions ADLs/Self Care Home Management;Cryotherapy;Electrical Stimulation;Iontophoresis 78m/ml Dexamethasone;Moist Heat;Ultrasound;Gait training;Stair training;Functional mobility  training;Therapeutic activities;Therapeutic exercise;Balance training;Neuromuscular re-education;Patient/family education;Manual techniques;Passive range of motion;Dry needling;Taping;Spinal Manipulations;Joint Manipulations    PT Next  Visit Plan reassess SIJ alignment; progress lumbopelvic flexibilty; core/lumbopelvic strengthening and stabilization - emphasis on glutes; manual therapy + possible DN to address increased muscle tension; modalities PRN including possible ionto patch for L greater trochanter if pain not improving although only as last option as pt reporting adhesive tape sensitivity, review HEP and posture & body mechanics education PRN    PT Home Exercise Plan Access Code: [email or web address] (10/18, updated 10/20 & 10/31)    Consulted and Agree with Plan of Care Patient             Patient will benefit from skilled therapeutic intervention in order to improve the following deficits and impairments:  Abnormal gait, Decreased activity tolerance, Decreased balance, Decreased endurance, Decreased mobility, Impaired flexibility, Decreased strength, Difficulty walking, Increased fascial restricitons, Increased muscle spasms, Impaired perceived functional ability, Improper body mechanics, Postural dysfunction, Pain  Visit Diagnosis: Chronic left-sided low back pain without sciatica  Pain in left hip  Other muscle spasm  Muscle weakness (generalized)  Other symptoms and signs involving the musculoskeletal system     Problem List There are no problems to display for this patient.   Percival Spanish, PT 12/07/2020, 7:03 PM  [facility] [address]  [place], [place], [address] Phone: [phone]   Fax:  [phone]  Name: Cassie Wilson MRN: [id number] Date of Birth: [birth year]

## 2020-12-11 ENCOUNTER — Other Ambulatory Visit: Payer: Self-pay

## 2020-12-11 ENCOUNTER — Ambulatory Visit: Payer: Medicare Other | Admitting: Physical Therapy

## 2020-12-11 ENCOUNTER — Encounter: Payer: Self-pay | Admitting: Physical Therapy

## 2020-12-11 DIAGNOSIS — M62838 Other muscle spasm: Secondary | ICD-10-CM

## 2020-12-11 DIAGNOSIS — M25552 Pain in left hip: Secondary | ICD-10-CM

## 2020-12-11 DIAGNOSIS — M545 Low back pain, unspecified: Secondary | ICD-10-CM

## 2020-12-11 DIAGNOSIS — M6281 Muscle weakness (generalized): Secondary | ICD-10-CM

## 2020-12-11 DIAGNOSIS — G8929 Other chronic pain: Secondary | ICD-10-CM

## 2020-12-11 DIAGNOSIS — R29898 Other symptoms and signs involving the musculoskeletal system: Secondary | ICD-10-CM

## 2020-12-11 NOTE — Patient Instructions (Signed)
    Access Code: [email or web address] URL: [email or web address] Date: 12/11/2020 Prepared by: Glenetta Hew  Exercises Hooklying Hamstring Stretch with Strap - 2-3 x daily - 7 x weekly - 3 reps - 30 sec hold Supine ITB Stretch with Strap - 2-3 x daily - 7 x weekly - 3 reps - 30 sec hold Supine Piriformis Stretch with Foot on Ground - 2-3 x daily - 7 x weekly - 3 reps - 30 sec hold Supine Figure 4 Piriformis Stretch - 2-3 x daily - 7 x weekly - 3 reps - 30 sec hold Supine Hip Adduction Isometric with Ball - 2 x daily - 7 x weekly - 2 sets - 10 reps - 5 sec hold Hooklying Isometric Clamshell - 1 x daily - 7 x weekly - 2 sets - 10 reps - 3 sec hold Supine Bridge with Resistance Band - 1 x daily - 7 x weekly - 2 sets - 10 reps - 5 sec hold Supine Piriformis Stretch - 2 x daily - 7 x weekly - 3 reps - 30 sec hold Standing ITB Stretch - 2-3 x daily - 7 x weekly - 3 reps - 30 sec hold Standing Bilateral Low Shoulder Row with Anchored Resistance - 1 x daily - 7 x weekly - 2 sets - 10 reps - 5 sec hold Scapular Retraction with Resistance Advanced - 1 x daily - 7 x weekly - 2 sets - 10 reps - 5 sec hold Standing Anti-Rotation Press with Anchored Resistance - 1 x daily - 7 x weekly - 2 sets - 10 reps - 3 sec hold  Patient Education Hospital doctor

## 2020-12-11 NOTE — Therapy (Signed)
[facility] [address]  [place], [place], [address] Phone: [phone]   Fax:  [phone]  Physical Therapy Treatment  Patient Details  Name: Cassie Wilson MRN: [id number] Date of Birth: [birth year] Referring Provider (PT): Gaynelle Arabian, MD   Encounter Date: 12/11/2020   PT End of Session - 12/11/20 1531     Visit Number 7    Number of Visits 12    Date for PT Re-Evaluation 01/02/21    Authorization Type Medicare, BCBS, Tricare for Life    PT Start Time 1531    PT Stop Time 1622    PT Time Calculation (min) 51 min    Activity Tolerance Patient tolerated treatment well    Behavior During Therapy [facility] for tasks assessed/performed             Past Medical History:  Diagnosis Date   Back pain    Diabetes mellitus without complication ([place])    Gas    Hypertension    Panic attack    Post-menopause     Past Surgical History:  Procedure Laterality Date   neck nerve block     TRIGGER FINGER RELEASE      There were no vitals filed for this visit.   Subjective Assessment - 12/11/20 1536     Subjective Pt reports less pain and feeling of knots in her muscles since addressing SIJ last visit.    Patient Stated Goals "to be able to get up in the morning and stretch out so I can get dressed easier and garden w/o pain"    Currently in Pain? Yes    Pain Score 1    2/10 earlier today   Pain Location Buttocks    Pain Orientation Left;Lower    Pain Descriptors / Indicators Discomfort    Pain Type Chronic pain                               [facility] Adult PT Treatment/Exercise - 12/11/20 1531       Exercises   Exercises Lumbar      Lumbar Exercises: Stretches   Other Lumbar Stretch Exercise Hesch stretch for L Anterior Ilium Fixation - thomas position over end of mat table with L knee flexed and foot on edge of table with hip slightly ABD/ER and standing with L foot on mat table with  slight hip ABD/ER x 2 min eah      Lumbar Exercises: Aerobic   Nustep L4 x 6 min (UE/LE)      Lumbar Exercises: Standing   Row Both;10 reps;Strengthening;Theraband    Theraband Level (Row) Level 2 (Red)    Row Limitations cues for scap retraction & abd bracing    Shoulder Extension Both;10 reps;Strengthening;Theraband    Theraband Level (Shoulder Extension) Level 2 (Red)    Shoulder Extension Limitations cues for scap retraction & abd bracing    Other Standing Lumbar Exercises B red TB pallof press x 10 each direction      Lumbar Exercises: Supine   Pelvic Tilt 10 reps;5 seconds    Pelvic Tilt Limitations PPT + hip ADD ball squeeze    Clam 10 reps;3 seconds    Clam Limitations TrA/PPT + red TB alt hip ABD/ER    Bridge with clamshell 10 reps;5 seconds    Bridge with [employer] Limitations red TB - B clam      Lumbar Exercises: Quadruped  [place]/Old Horse 10 reps    Straight Leg Raise 10 reps;2 seconds    Opposite Arm/Leg Raise Right arm/Left leg;Left arm/Right leg;10 reps;2 seconds      Manual Therapy   Manual Therapy Muscle Energy Technique;Joint mobilization    Joint Mobilization Sustained hooklying manual posterior rotation of ilium x 2 min with posterior rotation mobs 2 x 10    Muscle Energy Technique MET to correct apparent L anterior rotation of pelvis on sacrum followed by pelvic shotgun - audible cavitation noted at pubic symphsis with pelvic shotgun                     PT Education - 12/11/20 1622     Education Details Access Code: [email or web address]    Person(s) Educated Patient    Methods Explanation;Demonstration;Verbal cues;Tactile cues;Handout    Comprehension Verbalized understanding;Verbal cues required;Tactile cues required;Returned demonstration;Need further instruction              PT Short Term Goals - 11/30/20 1548       PT SHORT TERM GOAL #1   Title Patient will be independent with initial HEP    Status Achieved   11/30/20     PT SHORT  TERM GOAL #2   Title Patient will verbalize/demonstrate understanding of neutral spine posture and proper body mechanics to reduce strain on lumbar spine    Status Achieved   11/30/20              PT Long Term Goals - 12/11/20 1615       PT LONG TERM GOAL #1   Title Patient will be independent with ongoing/advanced HEP for self-management at home in order to build upon functional gains in therapy    Status Partially Met    Target Date 01/02/21      PT LONG TERM GOAL #2   Title Patient to report reduction in frequency and intensity of LBP and L hip pain by >/= 50-75% to allow for improved activity tolerance    Status On-going    Target Date 01/02/21      PT LONG TERM GOAL #3   Title Patient to improve tissue quality as noted by reduced tissue tightness and tenderness to palpation    Status Partially Met    Target Date 01/02/21      PT LONG TERM GOAL #4   Title Patient will demonstrate improved B proximal LE strength to >/= 4+/5 for improved stability and ease of mobility    Status On-going    Target Date 01/02/21      PT LONG TERM GOAL #5   Title Patient to report ability to perform ADLs including LB dressing, household tasks, and leisure activities without limitation due to LBP, L hip pain or weakness    Status On-going    Target Date 01/02/21                   Plan - 12/11/20 1539     Clinical Impression Statement Cassie Wilson reports reduction in pain and muscle tightness since addressing the SIJ last visit but still having some discomfort in L glute muscles. Reassessment of SIJ revealing slight recurrence of L anterior rotation which was easily reduced with MET followed by STM to address increased muscle tension in L glutes and piriformis. Continued core and lumbopelvic strengthening including progression of quadruped and standing activities with good tolerance but some cueing necessary to maintain neutral spine and avoid shoulder shrug/elevation. HEP updated to reflect  exercise  progression.    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, trigger finger release    Rehab Potential Good    PT Frequency 2x / week    PT Duration 6 weeks    PT Treatment/Interventions ADLs/Self Care Home Management;Cryotherapy;Electrical Stimulation;Iontophoresis 57m/ml Dexamethasone;Moist Heat;Ultrasound;Gait training;Stair training;Functional mobility training;Therapeutic activities;Therapeutic exercise;Balance training;Neuromuscular re-education;Patient/family education;Manual techniques;Passive range of motion;Dry needling;Taping;Spinal Manipulations;Joint Manipulations    PT Next Visit Plan reassess SIJ alignment; progress lumbopelvic flexibilty; core/lumbopelvic strengthening and stabilization - emphasis on glutes; manual therapy + possible DN to address increased muscle tension; modalities PRN including possible ionto patch for L greater trochanter if pain not improving although only as last option as pt reporting adhesive tape sensitivity, review HEP and posture & body mechanics education PRN    PT Home Exercise Plan Access Code: [email or web address] (10/18, updated 10/20, 10/31 & 11/7)    Consulted and Agree with Plan of Care Patient             Patient will benefit from skilled therapeutic intervention in order to improve the following deficits and impairments:  Abnormal gait, Decreased activity tolerance, Decreased balance, Decreased endurance, Decreased mobility, Impaired flexibility, Decreased strength, Difficulty walking, Increased fascial restricitons, Increased muscle spasms, Impaired perceived functional ability, Improper body mechanics, Postural dysfunction, Pain  Visit Diagnosis: Chronic left-sided low back pain without sciatica  Pain in left hip  Other muscle spasm  Muscle weakness (generalized)  Other symptoms and signs involving the musculoskeletal system     Problem  List There are no problems to display for this patient.   Cassie Wilson Spanish PT 12/11/2020, 4:40 PM  [facility] [place] [address]Phone: [phone]  Fax:  [phone] Name: KDarrel BaroniMRN: [id number] of Birth: 109/27/1947

## 2020-12-14 ENCOUNTER — Encounter: Payer: Self-pay | Admitting: Physical Therapy

## 2020-12-14 ENCOUNTER — Other Ambulatory Visit: Payer: Self-pay

## 2020-12-14 ENCOUNTER — Ambulatory Visit: Payer: Medicare Other | Admitting: Physical Therapy

## 2020-12-14 DIAGNOSIS — M25552 Pain in left hip: Secondary | ICD-10-CM

## 2020-12-14 DIAGNOSIS — M6281 Muscle weakness (generalized): Secondary | ICD-10-CM

## 2020-12-14 DIAGNOSIS — G8929 Other chronic pain: Secondary | ICD-10-CM

## 2020-12-14 DIAGNOSIS — M545 Low back pain, unspecified: Secondary | ICD-10-CM | POA: Diagnosis not present

## 2020-12-14 DIAGNOSIS — M62838 Other muscle spasm: Secondary | ICD-10-CM

## 2020-12-14 DIAGNOSIS — R29898 Other symptoms and signs involving the musculoskeletal system: Secondary | ICD-10-CM

## 2020-12-14 NOTE — Therapy (Addendum)
[facility] [address]  [place], [place], [address] Phone: [phone]   Fax:  [phone]  Physical Therapy Treatment  Patient Details  Name: Cassie Wilson MRN: [id number] Date of Birth: 1945-10-07 Referring Provider (PT): Gaynelle Arabian, MD   Encounter Date: 12/14/2020   PT End of Session - 12/14/20 1532     Visit Number 8    Number of Visits 12    Date for PT Re-Evaluation 01/02/21    Authorization Type Medicare, BCBS, Tricare for Life    PT Start Time 1532    PT Stop Time 1614    PT Time Calculation (min) 42 min    Activity Tolerance Patient tolerated treatment well    Behavior During Therapy [facility] for tasks assessed/performed             Past Medical History:  Diagnosis Date   Back pain    Diabetes mellitus without complication ([place])    Gas    Hypertension    Panic attack    Post-menopause     Past Surgical History:  Procedure Laterality Date   neck nerve block     TRIGGER FINGER RELEASE      There were no vitals filed for this visit.   Subjective Assessment - 12/14/20 1537     Subjective Pt reports she woke up with her low back tight this morning. Now noting mild pain in L>R buttock.    Patient Stated Goals "to be able to get up in the morning and stretch out so I can get dressed easier and garden w/o pain"    Currently in Pain? Yes    Pain Score 2     Pain Location Buttocks    Pain Orientation Left;Right   L>R   Pain Descriptors / Indicators Discomfort    Pain Type Chronic pain                               [facility] Adult PT Treatment/Exercise - 12/14/20 1532       Lumbar Exercises: Aerobic   Nustep L5 x 6 min (UE/LE)      Lumbar Exercises: Standing   Functional Squats 10 reps;3 seconds    Functional Squats Limitations counter squat + red TB hip ABD isometric - cues for posture & posterior weight shift avoiding knees fwd of toes    Other Standing Lumbar  Exercises B red TB side-stepping & fwd/back monster walk along counter 4 x 10-12 ft - cues for slow pace maintaing tension on band t/o step   TB at ankles     Lumbar Exercises: Seated   Long Arc Quad on [place] Both;10 reps;AROM;Strengthening    LAQ on [place] Limitations cues for abd bracing maintaining neutral pelvis - seated on green Pball    Hip Flexion on Ball Both;10 reps;AROM    Hip Flexion on Ball Limitations cues for abd bracing maintaining neutral pelvis - seated on green Pball    Other Seated Lumbar Exercises B shoulder horiz ABD & UE diagonals with red TB x 10 each - cues for abd bracing maintaining neutral pelvis - seated on green Pball      Lumbar Exercises: Supine   Bent Knee Raise 10 reps;3 seconds    Bent Knee Raise Limitations TrA + red TB march    Bridge with clamshell 10 reps;5 seconds    Bridge with [employer] Limitations red TB -  B clam    Bridge with March 10 reps;3 seconds    Bridge with [employer] Limitations looped red TB at knees   pt noting intermittent cramping in HS - cues to avoid pushing heels into mat table to reduce cramping   Other Supine Lumbar Exercises Straight leg bridge with heels on peanut ball 10 x 3"                       PT Short Term Goals - 11/30/20 1548       PT SHORT TERM GOAL #1   Title Patient will be independent with initial HEP    Status Achieved   11/30/20     PT SHORT TERM GOAL #2   Title Patient will verbalize/demonstrate understanding of neutral spine posture and proper body mechanics to reduce strain on lumbar spine    Status Achieved   11/30/20              PT Long Term Goals - 12/11/20 1615       PT LONG TERM GOAL #1   Title Patient will be independent with ongoing/advanced HEP for self-management at home in order to build upon functional gains in therapy    Status Partially Met    Target Date 01/02/21      PT LONG TERM GOAL #2   Title Patient to report reduction in frequency and intensity of LBP and  L hip pain by >/= 50-75% to allow for improved activity tolerance    Status On-going    Target Date 01/02/21      PT LONG TERM GOAL #3   Title Patient to improve tissue quality as noted by reduced tissue tightness and tenderness to palpation    Status Partially Met    Target Date 01/02/21      PT LONG TERM GOAL #4   Title Patient will demonstrate improved B proximal LE strength to >/= 4+/5 for improved stability and ease of mobility    Status On-going    Target Date 01/02/21      PT LONG TERM GOAL #5   Title Patient to report ability to perform ADLs including LB dressing, household tasks, and leisure activities without limitation due to LBP, L hip pain or weakness    Status On-going    Target Date 01/02/21                   Plan - 12/14/20 1540     Clinical Impression Statement Lyndzee reports more muscle-oriented pain today and only very mild. SIJ alignment remains neutral today and pt denies need for STM, therefore progressed strengthening targeting core and proximal LE stability. Good tolerance for all exercises. No formal additions to HEP provided, however pt questioning how to duplicate exercises at home - informed pt that all exercises performed during therapy sessions are not necessarily intended to be duplicated at home but if she were to attempt to perform exercises at home, she should limit strengthening exercises to ~5-6 exercises per day and vary exercises from day to day to avoid overuse irritation.    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, trigger finger release    Rehab Potential Good    PT Frequency 2x / week    PT Duration 6 weeks    PT Treatment/Interventions ADLs/Self Care Home Management;Cryotherapy;Electrical Stimulation;Iontophoresis 72m/ml Dexamethasone;Moist Heat;Ultrasound;Gait training;Stair training;Functional mobility training;Therapeutic  activities;Therapeutic exercise;Balance  training;Neuromuscular re-education;Patient/family education;Manual techniques;Passive range of motion;Dry needling;Taping;Spinal Manipulations;Joint Manipulations    PT Next Visit Plan reassess SIJ alignment PRN; progress lumbopelvic flexibilty; core/lumbopelvic strengthening and stabilization - emphasis on glutes; manual therapy + possible DN to address increased muscle tension; modalities PRN including possible ionto patch for L greater trochanter if pain not improving although only as last option as pt reporting adhesive tape sensitivity, review HEP and posture & body mechanics education PRN    PT Home Exercise Plan Access Code: [email or web address] (10/18, updated 10/20, 10/31 & 11/7)    Consulted and Agree with Plan of Care Patient             Patient will benefit from skilled therapeutic intervention in order to improve the following deficits and impairments:  Abnormal gait, Decreased activity tolerance, Decreased balance, Decreased endurance, Decreased mobility, Impaired flexibility, Decreased strength, Difficulty walking, Increased fascial restricitons, Increased muscle spasms, Impaired perceived functional ability, Improper body mechanics, Postural dysfunction, Pain  Visit Diagnosis: Chronic left-sided low back pain without sciatica  Pain in left hip  Other muscle spasm  Muscle weakness (generalized)  Other symptoms and signs involving the musculoskeletal system     Problem List There are no problems to display for this patient.   Percival Spanish, PT 12/14/2020, 6:53 PM  [facility] [address]  [place], [place], [address] Phone: [phone]   Fax:  [phone]  Name: Deshonna Trnka MRN: [id number] Date of Birth: April 17, 1945

## 2020-12-18 ENCOUNTER — Other Ambulatory Visit: Payer: Self-pay

## 2020-12-18 ENCOUNTER — Ambulatory Visit: Payer: Medicare Other | Admitting: Physical Therapy

## 2020-12-18 ENCOUNTER — Encounter: Payer: Self-pay | Admitting: Physical Therapy

## 2020-12-18 DIAGNOSIS — R29898 Other symptoms and signs involving the musculoskeletal system: Secondary | ICD-10-CM

## 2020-12-18 DIAGNOSIS — G8929 Other chronic pain: Secondary | ICD-10-CM

## 2020-12-18 DIAGNOSIS — M545 Low back pain, unspecified: Secondary | ICD-10-CM | POA: Diagnosis not present

## 2020-12-18 DIAGNOSIS — M6281 Muscle weakness (generalized): Secondary | ICD-10-CM

## 2020-12-18 DIAGNOSIS — M25552 Pain in left hip: Secondary | ICD-10-CM

## 2020-12-18 DIAGNOSIS — M62838 Other muscle spasm: Secondary | ICD-10-CM

## 2020-12-18 NOTE — Patient Instructions (Signed)
    Access Code: [email or web address] URL: [email or web address] Date: 12/18/2020 Prepared by: Glenetta Hew  Exercises Hooklying Hamstring Stretch with Strap - 2-3 x daily - 7 x weekly - 3 reps - 30 sec hold Supine ITB Stretch with Strap - 2-3 x daily - 7 x weekly - 3 reps - 30 sec hold Supine Piriformis Stretch with Foot on Ground - 2-3 x daily - 7 x weekly - 3 reps - 30 sec hold Supine Figure 4 Piriformis Stretch - 2-3 x daily - 7 x weekly - 3 reps - 30 sec hold Supine Hip Adduction Isometric with Ball - 2 x daily - 7 x weekly - 2 sets - 10 reps - 5 sec hold Hooklying Isometric Clamshell - 1 x daily - 7 x weekly - 2 sets - 10 reps - 3 sec hold Supine Bridge with Resistance Band - 1 x daily - 7 x weekly - 2 sets - 10 reps - 5 sec hold Supine Piriformis Stretch - 2 x daily - 7 x weekly - 3 reps - 30 sec hold Standing ITB Stretch - 2-3 x daily - 7 x weekly - 3 reps - 30 sec hold Standing Bilateral Low Shoulder Row with Anchored Resistance - 1 x daily - 7 x weekly - 2 sets - 10 reps - 5 sec hold Scapular Retraction with Resistance Advanced - 1 x daily - 7 x weekly - 2 sets - 10 reps - 5 sec hold Standing Anti-Rotation Press with Anchored Resistance - 1 x daily - 7 x weekly - 2 sets - 10 reps - 3 sec hold Standing Hip Abduction with Resistance at Ankles - 1 x daily - 7 x weekly - 2 sets - 10 reps - 3 sec hold Diagonal Hip Extension with Resistance - 1 x daily - 7 x weekly - 2 sets - 10 reps - 3 sec hold Hip Extension with Resistance Loop - 1 x daily - 7 x weekly - 2 sets - 10 reps - 3 sec hold  Patient Education Hospital doctor

## 2020-12-18 NOTE — Therapy (Signed)
[facility] [address]  [place], [place], [address] Phone: [phone]   Fax:  [phone]  Physical Therapy Treatment  Patient Details  Name: Cassie Wilson MRN: [id number] Date of Birth: 09-11-1945 Referring Provider (PT): Gaynelle Arabian, MD   Encounter Date: 12/18/2020   PT End of Session - 12/18/20 1532     Visit Number 9    Number of Visits 12    Date for PT Re-Evaluation 01/02/21    Authorization Type Medicare, BCBS, Tricare for Life    PT Start Time 1532    PT Stop Time 1628    PT Time Calculation (min) 56 min    Activity Tolerance Patient tolerated treatment well    Behavior During Therapy [facility] for tasks assessed/performed             Past Medical History:  Diagnosis Date   Back pain    Diabetes mellitus without complication ([place])    Gas    Hypertension    Panic attack    Post-menopause     Past Surgical History:  Procedure Laterality Date   neck nerve block     TRIGGER FINGER RELEASE      There were no vitals filed for this visit.   Subjective Assessment - 12/18/20 1536     Subjective Pt noting some discomfort in her L knee with TB resisted sidestepping. Otherwise L buttock pain only very mild today.    Patient Stated Goals "to be able to get up in the morning and stretch out so I can get dressed easier and garden w/o pain"    Currently in Pain? Yes    Pain Score 1    1.5/10   Pain Location Buttocks    Pain Orientation Left    Pain Descriptors / Indicators Discomfort    Pain Type Chronic pain                [facility] PT Assessment - 12/18/20 1532       Strength   Right Hip Flexion 4/5    Right Hip Extension 4/5    Right Hip External Rotation  4+/5    Right Hip Internal Rotation 4+/5    Right Hip ABduction 4+/5    Right Hip ADduction 4+/5    Left Hip Flexion 4/5   pain in L buttock   Left Hip Extension 4/5    Left Hip External Rotation 4+/5    Left Hip Internal Rotation  4+/5   mildly painful   Left Hip ABduction 4/5    Left Hip ADduction 4+/5                           [facility] Adult PT Treatment/Exercise - 12/18/20 1532       Lumbar Exercises: Aerobic   Nustep L5 x 6 min (LE only)      Lumbar Exercises: Sidelying   Hip Abduction Left;10 reps;3 seconds    Hip Abduction Weights (lbs) + slight hip extension    Other Sidelying Lumbar Exercises L hip fwd/back taps over R LE x 10    Other Sidelying Lumbar Exercises L hip CW/CCW circles in slight abduction & extension x 10 each      Knee/Hip Exercises: Standing   Hip Flexion Left;Right;10 reps;Stengthening;Knee bent    Hip Flexion Limitations looped red TB at midfoot    Hip Abduction Left;Right;10 reps;Knee straight;Stengthening    Abduction Limitations looped red  TB at ankles    Hip Extension Left;Right;10 reps;Knee straight;Stengthening    Extension Limitations looped red TB at ankles    Other Standing Knee Exercises L/R fwd/back arc with looped red TB at ankles x 10 - cues to slow pace for better control      Manual Therapy   Manual Therapy Soft tissue mobilization;Myofascial release    Soft tissue mobilization STM/DTM to L lateral glutes    Myofascial Release manual TPR to L lateral glute medius/minimus                     PT Education - 12/18/20 1634     Education Details HEP update - Access Code: [email or web address]    Person(s) Educated Patient    Methods Explanation;Demonstration;Verbal cues;Handout    Comprehension Verbalized understanding;Verbal cues required;Returned demonstration;Need further instruction              PT Short Term Goals - 11/30/20 1548       PT SHORT TERM GOAL #1   Title Patient will be independent with initial HEP    Status Achieved   11/30/20     PT SHORT TERM GOAL #2   Title Patient will verbalize/demonstrate understanding of neutral spine posture and proper body mechanics to reduce strain on lumbar spine    Status Achieved   11/30/20               PT Long Term Goals - 12/11/20 1615       PT LONG TERM GOAL #1   Title Patient will be independent with ongoing/advanced HEP for self-management at home in order to build upon functional gains in therapy    Status Partially Met    Target Date 01/02/21      PT LONG TERM GOAL #2   Title Patient to report reduction in frequency and intensity of LBP and L hip pain by >/= 50-75% to allow for improved activity tolerance    Status On-going    Target Date 01/02/21      PT LONG TERM GOAL #3   Title Patient to improve tissue quality as noted by reduced tissue tightness and tenderness to palpation    Status Partially Met    Target Date 01/02/21      PT LONG TERM GOAL #4   Title Patient will demonstrate improved B proximal LE strength to >/= 4+/5 for improved stability and ease of mobility    Status On-going    Target Date 01/02/21      PT LONG TERM GOAL #5   Title Patient to report ability to perform ADLs including LB dressing, household tasks, and leisure activities without limitation due to LBP, L hip pain or weakness    Status On-going    Target Date 01/02/21                   Plan - 12/18/20 1628     Clinical Impression Statement Cassie Wilson reports pain and muscle knots near SIJ have resolved since MET to correct SIJ alignment but still having very mild pain in lateral L glutes. Overall B hip strength has improved but L hip remains mildly weaker in abduction and B weakness still present in hip flexion and extension. Progressed strengthening to target areas of continued weakness with good tolerance but pt still noting mild soreness/pain in L lateral glutes. Addressed with manual STM and TPR and reviewed self-STM options with tennis ball in sitting or standing at home. HEP updated to reflect  progression of hip strengthening.    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,  trigger finger release    Rehab Potential Good    PT Frequency 2x / week    PT Duration 6 weeks    PT Treatment/Interventions ADLs/Self Care Home Management;Cryotherapy;Electrical Stimulation;Iontophoresis 34m/ml Dexamethasone;Moist Heat;Ultrasound;Gait training;Stair training;Functional mobility training;Therapeutic activities;Therapeutic exercise;Balance training;Neuromuscular re-education;Patient/family education;Manual techniques;Passive range of motion;Dry needling;Taping;Spinal Manipulations;Joint Manipulations    PT Next Visit Plan reassess SIJ alignment PRN; progress lumbopelvic flexibilty; core/lumbopelvic strengthening and stabilization - emphasis on glutes; manual therapy + possible DN to address increased muscle tension; modalities PRN including possible ionto patch for L greater trochanter if pain not improving although only as last option as pt reporting adhesive tape sensitivity, review HEP and posture & body mechanics education PRN    PT Home Exercise Plan Access Code: [email or web address] (10/18, updated 10/20, 10/31 & 11/7)    Consulted and Agree with Plan of Care Patient             Patient will benefit from skilled therapeutic intervention in order to improve the following deficits and impairments:  Abnormal gait, Decreased activity tolerance, Decreased balance, Decreased endurance, Decreased mobility, Impaired flexibility, Decreased strength, Difficulty walking, Increased fascial restricitons, Increased muscle spasms, Impaired perceived functional ability, Improper body mechanics, Postural dysfunction, Pain  Visit Diagnosis: Chronic left-sided low back pain without sciatica  Pain in left hip  Other muscle spasm  Muscle weakness (generalized)  Other symptoms and signs involving the musculoskeletal system     Problem List There are no problems to display for this patient.   JPercival Spanish PT 12/18/2020, 4:53 PM  [facility] [place] [address]Phone: [phone]  Fax:  [phone] Name: KShemeca LukasikMRN: [id number] of Birth: 111-Sep-1947

## 2020-12-21 ENCOUNTER — Other Ambulatory Visit: Payer: Self-pay

## 2020-12-21 ENCOUNTER — Ambulatory Visit: Payer: Medicare Other | Admitting: Physical Therapy

## 2020-12-21 DIAGNOSIS — M545 Low back pain, unspecified: Secondary | ICD-10-CM

## 2020-12-21 DIAGNOSIS — M62838 Other muscle spasm: Secondary | ICD-10-CM

## 2020-12-21 DIAGNOSIS — M6281 Muscle weakness (generalized): Secondary | ICD-10-CM

## 2020-12-21 DIAGNOSIS — R29898 Other symptoms and signs involving the musculoskeletal system: Secondary | ICD-10-CM

## 2020-12-21 DIAGNOSIS — M25552 Pain in left hip: Secondary | ICD-10-CM

## 2020-12-21 NOTE — Patient Instructions (Addendum)
TENS stands for Transcutaneous Electrical Nerve Stimulation. In other words, electrical impulses are allowed to pass through the skin in order to excite a nerve.   Purpose and Use of TENS:  TENS is a method used to manage acute and chronic pain without the use of drugs. It has been effective in managing pain associated with surgery, sprains, strains, trauma, rheumatoid arthritis, and neuralgias. It is a non-addictive, low risk, and non-invasive technique used to control pain. It is not, by any means, a curative form of treatment.   How TENS Works:  Most TENS units are a [employer]ian unit powered by one 9 volt battery. Attached to the outside of the unit are two lead wires where two pins and/or snaps connect on each wire. All units come with a set of four reusable pads or electrodes. These are placed on the skin surrounding the area involved. By inserting the leads into  the pads, the electricity can pass from the unit making the circuit complete.  As the intensity is turned up slowly, the electrical current enters the body from the electrodes through the skin to the surrounding nerve fibers. This triggers the release of hormones from within the body. These hormones contain pain relievers. By increasing the circulation of these hormones, the person's pain may be lessened. It is also believed that the electrical stimulation itself helps to block the pain messages being sent to the brain, thus also decreasing the body's perception of pain.   Hazards:  TENS units are NOT to be used by patients with PACEMAKERS, DEFIBRILLATORS, DIABETIC PUMPS, PREGNANT WOMEN, and patients with SEIZURE DISORDERS.  TENS units are NOT to be used over the heart, throat, brain, or spinal cord.  One of the major side effects from the TENS unit may be skin irritation. Some people may develop a rash if they are sensitive to the materials used in the electrodes or the connecting wires.   Wear the unit for 15-30-45 minutes up to  3-4x/day.   Avoid overuse due the body getting used to the stem making it not as effective over time.    TENS UNIT  This is helpful for muscle pain and spasm.   Search and Purchase a TENS 7000 2nd edition at www.tenspros.com or www.amazon.com  (It should be less than $30)     TENS unit instructions:  Do not shower or bathe with the unit on Turn the unit off before removing electrodes or batteries If the electrodes lose stickiness add a drop of water to the electrodes after they are disconnected from the unit and place on plastic sheet. If you continued to have difficulty, call the TENS unit company to purchase more electrodes. Do not apply lotion on the skin area prior to use. Make sure the skin is clean and dry as this will help prolong the life of the electrodes. After use, always check skin for unusual red areas, rash or other skin difficulties. If there are any skin problems, does not apply electrodes to the same area. Never remove the electrodes from the unit by pulling the wires. Do not use the TENS unit or electrodes other than as directed. Do not change electrode placement without consulting your therapist or physician. Keep 2 fingers with between each electrode.

## 2020-12-21 NOTE — Therapy (Signed)
[facility] [address]  [place], [place], [address] Phone: [phone]   Fax:  [phone]  Physical Therapy Treatment / Progress Note  Patient Details  Name: Cassie Wilson MRN: [id number] Date of Birth: July 14, 1945 Referring Provider (PT): Gaynelle Arabian, MD  Progress Note  Reporting Period 11/21/2020 to 12/21/2020  See note below for Objective Data and Assessment of Progress/Goals.     Encounter Date: 12/21/2020   PT End of Session - 12/21/20 1532     Visit Number 10    Number of Visits 12    Date for PT Re-Evaluation 01/02/21    Authorization Type Medicare, BCBS, Tricare for Life    PT Start Time 1532    PT Stop Time 1628    PT Time Calculation (min) 56 min    Activity Tolerance Patient tolerated treatment well    Behavior During Therapy WFL for tasks assessed/performed             Past Medical History:  Diagnosis Date   Back pain    Diabetes mellitus without complication ([place])    Gas    Hypertension    Panic attack    Post-menopause     Past Surgical History:  Procedure Laterality Date   neck nerve block     TRIGGER FINGER RELEASE      There were no vitals filed for this visit.   Subjective Assessment - 12/21/20 1536     Subjective Pt reports pain still typically worst in the morning but improves as she get moving/walking - still has difficulty getting dressed in the morning but everything else that she needs to do during the day seems ok. Will occasionally note discomfort down the lateral thigh/leg if laying on her side in bed - able to resolve if she rolls to other side.    Patient Stated Goals "to be able to get up in the morning and stretch out so I can get dressed easier and garden w/o pain"    Currently in Pain? Yes    Pain Score 0-No pain   up to 2/10   Pain Location Buttocks    Pain Orientation Left    Pain Descriptors / Indicators Aching;Discomfort    Pain Type Chronic pain     Pain Frequency Intermittent                [facility] PT Assessment - 12/21/20 1532       Assessment   Medical Diagnosis L sided LBP    Referring Provider (PT) Gaynelle Arabian, MD    Onset Date/Surgical Date --   January 2022   Next MD Visit PRN if PT not helping      Observation/Other Assessments   Focus on Therapeutic Outcomes (FOTO)  Lumbar = 63 (unchanged from eval), predicted D/C FS = 70      Strength   Overall Strength Comments MMT as of 12/18/20    Right Hip Flexion 4/5    Right Hip Extension 4/5    Right Hip External Rotation  4+/5    Right Hip Internal Rotation 4+/5    Right Hip ABduction 4+/5    Right Hip ADduction 4+/5    Left Hip Flexion 4/5   pain in L buttock   Left Hip Extension 4/5    Left Hip External Rotation 4+/5    Left Hip Internal Rotation 4+/5   mildly painful   Left Hip ABduction 4/5    Left Hip ADduction  4+/5                           [facility] Adult PT Treatment/Exercise - 12/21/20 1532       Exercises   Exercises Lumbar      Lumbar Exercises: Stretches   Passive Hamstring Stretch Left;Right;3 reps;30 seconds    Passive Hamstring Stretch Limitations hooklying with strap - cues for medial/lateral hip rotation to target medial/lateral HS      Lumbar Exercises: Aerobic   Nustep L5 x 6 min (LE only)      Manual Therapy   Manual Therapy Soft tissue mobilization;Myofascial release    Soft tissue mobilization STM/DTM and manual foam rolling  to L mid/lateral glutes    Myofascial Release manual TPR to L lateral glute medius/minimus                     PT Education - 12/21/20 1628     Education Details Review of progress to date, review of HEP and role of each stretch/strengthening exercise; review of role of DN to address abnormal muscle tension; education of role of TENS/estim    Person(s) Educated Patient    Methods Explanation;Handout    Comprehension Verbalized understanding              PT Short Term  Goals - 11/30/20 1548       PT SHORT TERM GOAL #1   Title Patient will be independent with initial HEP    Status Achieved   11/30/20     PT SHORT TERM GOAL #2   Title Patient will verbalize/demonstrate understanding of neutral spine posture and proper body mechanics to reduce strain on lumbar spine    Status Achieved   11/30/20              PT Long Term Goals - 12/21/20 1538       PT LONG TERM GOAL #1   Title Patient will be independent with ongoing/advanced HEP for self-management at home in order to build upon functional gains in therapy    Status Partially Met    Target Date 01/02/21      PT LONG TERM GOAL #2   Title Patient to report reduction in frequency and intensity of LBP and L hip pain by >/= 50-75% to allow for improved activity tolerance    Status Achieved   12/21/20 - Pt noting 50% improvement overall, but 100% at SIJ     PT LONG TERM GOAL #3   Title Patient to improve tissue quality as noted by reduced tissue tightness and tenderness to palpation    Status Partially Met    Target Date 01/02/21      PT LONG TERM GOAL #4   Title Patient will demonstrate improved B proximal LE strength to >/= 4+/5 for improved stability and ease of mobility    Status Partially Met    Target Date 01/02/21      PT LONG TERM GOAL #5   Title Patient to report ability to perform ADLs including LB dressing, household tasks, and leisure activities without limitation due to LBP, L hip pain or weakness    Status Partially Met    Target Date 01/02/21                   Plan - 12/21/20 1628     Clinical Impression Statement Audriella reports 50% overall improvement in her low back/buttock pain since start of  PT with 100% improvement in SIJ pain. Her main ongoing issue is tightness and pain in mid to lateral L glutes, typically worst upon rising in the morning and improving as she moves around throughout the day, hence her greatest difficulty is with lower body dressing in the  morning. Her flexibility is improving but increased muscle tension tightness persists in her L glutes/piriformis and R HS/ITB. She reports independence with the stretches and self-STM techniques recommended but has been unable to fully resolve the tension/tightness. We have discussed DN but up until now she has been reluctant to try this, although she thinks she would like to try DN next visit. Her strength is improving but mild proximal LE weakness still evident - she is independent with strengthening exercises to target the ongoing weakness. All of her goals at now met or partially met and unless we are able to make a significant difference with the DN, we will likely proceeded with transition to her HEP as of the end of her current POC.    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, trigger finger release    Rehab Potential Good    PT Frequency 2x / week    PT Duration 6 weeks    PT Treatment/Interventions ADLs/Self Care Home Management;Cryotherapy;Electrical Stimulation;Iontophoresis 55m/ml Dexamethasone;Moist Heat;Ultrasound;Gait training;Stair training;Functional mobility training;Therapeutic activities;Therapeutic exercise;Balance training;Neuromuscular re-education;Patient/family education;Manual techniques;Passive range of motion;Dry needling;Taping;Spinal Manipulations;Joint Manipulations    PT Next Visit Plan manual therapy + possible DN to address increased muscle tension; possible trial of TENS for muscle relaxation/pain management; review/update of HEP for lumbopelvic flexibilty and core/lumbopelvic strengthening and stabilization (emphasis on glutes) in prep for transition to HEP    PT Home Exercise Plan Access Code: [email or web address] (10/18, updated 10/20, 10/31 & 11/7)    Consulted and Agree with Plan of Care Patient             Patient will benefit from skilled therapeutic intervention in order  to improve the following deficits and impairments:  Abnormal gait, Decreased activity tolerance, Decreased balance, Decreased endurance, Decreased mobility, Impaired flexibility, Decreased strength, Difficulty walking, Increased fascial restricitons, Increased muscle spasms, Impaired perceived functional ability, Improper body mechanics, Postural dysfunction, Pain  Visit Diagnosis: Chronic left-sided low back pain without sciatica  Pain in left hip  Other muscle spasm  Muscle weakness (generalized)  Other symptoms and signs involving the musculoskeletal system     Problem List There are no problems to display for this patient.   JPercival Spanish PT 12/21/2020, 4:54 PM  [facility] [place] [address]Phone: [phone]  Fax:  [phone] Name: KDebara KamphuisMRN: [id number] of Birth: [birth year]

## 2020-12-25 ENCOUNTER — Other Ambulatory Visit: Payer: Self-pay

## 2020-12-25 ENCOUNTER — Encounter: Payer: Self-pay | Admitting: Physical Therapy

## 2020-12-25 ENCOUNTER — Ambulatory Visit: Payer: Medicare Other | Admitting: Physical Therapy

## 2020-12-25 DIAGNOSIS — M25552 Pain in left hip: Secondary | ICD-10-CM

## 2020-12-25 DIAGNOSIS — R29898 Other symptoms and signs involving the musculoskeletal system: Secondary | ICD-10-CM

## 2020-12-25 DIAGNOSIS — M62838 Other muscle spasm: Secondary | ICD-10-CM

## 2020-12-25 DIAGNOSIS — M545 Low back pain, unspecified: Secondary | ICD-10-CM | POA: Diagnosis not present

## 2020-12-25 DIAGNOSIS — M6281 Muscle weakness (generalized): Secondary | ICD-10-CM

## 2020-12-25 DIAGNOSIS — G8929 Other chronic pain: Secondary | ICD-10-CM

## 2020-12-25 NOTE — Therapy (Signed)
[facility] [address]  [place], [place], [address] Phone: [phone]   Fax:  [phone]  Physical Therapy Treatment  Patient Details  Name: Cassie Wilson MRN: [id number] Date of Birth: 01/21/1946 Referring Provider (PT): Gaynelle Arabian, MD   Encounter Date: 12/25/2020   PT End of Session - 12/25/20 1532     Visit Number 11    Number of Visits 12    Date for PT Re-Evaluation 01/02/21    Authorization Type Medicare, BCBS, Tricare for Life    PT Start Time 1532    PT Stop Time 1614    PT Time Calculation (min) 42 min    Activity Tolerance Patient tolerated treatment well    Behavior During Therapy [facility] for tasks assessed/performed             Past Medical History:  Diagnosis Date   Back pain    Diabetes mellitus without complication ([place])    Gas    Hypertension    Panic attack    Post-menopause     Past Surgical History:  Procedure Laterality Date   neck nerve block     TRIGGER FINGER RELEASE      There were no vitals filed for this visit.   Subjective Assessment - 12/25/20 1534     Subjective Pt reports she was vacuuming this morning and her SIJ started bothering her. Also still notes the soreness/tightness in her glutes. Thinks she would like to proceed with trying the DN but still somewhat hesistant. She is requesting to check the alignment of her SIJ.    Patient Stated Goals "to be able to get up in the morning and stretch out so I can get dressed easier and garden w/o pain"    Currently in Pain? No/denies                               [facility] Adult PT Treatment/Exercise - 12/25/20 1532       Lumbar Exercises: Aerobic   Nustep L6 x 6 min (LE only)      Manual Therapy   Manual Therapy Soft tissue mobilization;Myofascial release    Manual therapy comments skilled palpation and monitoring during DN    Soft tissue mobilization STM/DTM to L medial glutes and mid/lateral  glutes/piriformis    Myofascial Release manual TPR to L glute medius/minimus & piriformis    Other Manual Therapy Reassessed SIJ alignment per pt request with alignment remaining WNL              Trigger Point Dry Needling - 12/25/20 1532     Consent Given? Yes    Education Handout Provided Previously provided    Muscles Treated Back/Hip Gluteus minimus;Gluteus medius;Gluteus maximus;Piriformis    Gluteus Minimus Response Twitch response elicited;Palpable increased muscle length    Gluteus Medius Response Twitch response elicited;Palpable increased muscle length    Gluteus Maximus Response Twitch response elicited;Palpable increased muscle length    Piriformis Response Twitch response elicited;Palpable increased muscle length                   PT Education - 12/25/20 1922     Education Details DN rational, procedure, outcomes, potential side effects, and recommended post-treatment exercises/activity    Person(s) Educated Patient    Methods Explanation   handout previously provided - pt declined need for new copy   Comprehension Verbalized understanding  PT Short Term Goals - 11/30/20 1548       PT SHORT TERM GOAL #1   Title Patient will be independent with initial HEP    Status Achieved   11/30/20     PT SHORT TERM GOAL #2   Title Patient will verbalize/demonstrate understanding of neutral spine posture and proper body mechanics to reduce strain on lumbar spine    Status Achieved   11/30/20              PT Long Term Goals - 12/21/20 1538       PT LONG TERM GOAL #1   Title Patient will be independent with ongoing/advanced HEP for self-management at home in order to build upon functional gains in therapy    Status Partially Met    Target Date 01/02/21      PT LONG TERM GOAL #2   Title Patient to report reduction in frequency and intensity of LBP and L hip pain by >/= 50-75% to allow for improved activity tolerance    Status Achieved    12/21/20 - Pt noting 50% improvement overall, but 100% at SIJ     PT LONG TERM GOAL #3   Title Patient to improve tissue quality as noted by reduced tissue tightness and tenderness to palpation    Status Partially Met    Target Date 01/02/21      PT LONG TERM GOAL #4   Title Patient will demonstrate improved B proximal LE strength to >/= 4+/5 for improved stability and ease of mobility    Status Partially Met    Target Date 01/02/21      PT LONG TERM GOAL #5   Title Patient to report ability to perform ADLs including LB dressing, household tasks, and leisure activities without limitation due to LBP, L hip pain or weakness    Status Partially Met    Target Date 01/02/21                   Plan - 12/25/20 1614     Clinical Impression Statement - Cassie Wilson reports increased SIJ pain with vacuuming earlier today but admits to bending and twisting rather than stepping with vacuum as previously instructed. Pain resolved with rest, but she did request PT check SIJ alignment today - alignment appears WNL. She continues to c/o increased muscle tension/tightness in L glutes > piriformis and reluctantly expressed interest in proceeding with DN as previously discussed. After repeat explanation of DN rational, procedures, outcomes and potential side effects, patient verbalized consent to DN treatment in conjunction with manual STM/DTM and TPR to reduce ttp/muscle tension. Muscles treated include L glute maximus/medius/minimus and piriformis. DN produced normal response with good twitches elicited resulting in palpable reduction in pain/ttp and muscle tension. Pt educated to expect mild to moderate muscle soreness for up to 24-48 hrs and instructed to continue prescribed home exercise program and current activity level with pt verbalizing understanding of theses instructions. Cassie Wilson has one visit remaining in her current PT episode - will assess long-term response to DN and based on response +/- potential  need for further manual intervention as well as goal assessment, will determine need for recert vs readiness to attempt transition to HEP.    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, trigger finger release    Rehab Potential Good    PT Frequency 2x / week    PT Duration 6  weeks    PT Treatment/Interventions ADLs/Self Care Home Management;Cryotherapy;Electrical Stimulation;Iontophoresis 95m/ml Dexamethasone;Moist Heat;Ultrasound;Gait training;Stair training;Functional mobility training;Therapeutic activities;Therapeutic exercise;Balance training;Neuromuscular re-education;Patient/family education;Manual techniques;Passive range of motion;Dry needling;Taping;Spinal Manipulations;Joint Manipulations    PT Next Visit Plan assess response to DN; goal assessment to determine need for recert vs D/C or transition to HEP +/- 30-day hold; FOTO if D/C or transition to HEP    PT Home Exercise Plan Access Code: [email or web address] (10/18, updated 10/20, 10/31 & 11/7)    Consulted and Agree with Plan of Care Patient             Patient will benefit from skilled therapeutic intervention in order to improve the following deficits and impairments:  Abnormal gait, Decreased activity tolerance, Decreased balance, Decreased endurance, Decreased mobility, Impaired flexibility, Decreased strength, Difficulty walking, Increased fascial restricitons, Increased muscle spasms, Impaired perceived functional ability, Improper body mechanics, Postural dysfunction, Pain  Visit Diagnosis: Chronic left-sided low back pain without sciatica  Pain in left hip  Other muscle spasm  Muscle weakness (generalized)  Other symptoms and signs involving the musculoskeletal system     Problem List There are no problems to display for this patient.   JPercival Spanish PT 12/25/2020, 7:34 PM  [facility] [place] [address]Phone: [phone]  Fax:  [phone] Name: Cassie KilloughMRN: [id number] of Birth: 106-03-1945

## 2020-12-25 NOTE — Patient Instructions (Signed)

## 2021-01-01 ENCOUNTER — Ambulatory Visit: Payer: Medicare Other | Admitting: Physical Therapy

## 2021-01-01 ENCOUNTER — Other Ambulatory Visit: Payer: Self-pay

## 2021-01-01 ENCOUNTER — Encounter: Payer: Self-pay | Admitting: Physical Therapy

## 2021-01-01 DIAGNOSIS — M6281 Muscle weakness (generalized): Secondary | ICD-10-CM

## 2021-01-01 DIAGNOSIS — R29898 Other symptoms and signs involving the musculoskeletal system: Secondary | ICD-10-CM

## 2021-01-01 DIAGNOSIS — G8929 Other chronic pain: Secondary | ICD-10-CM

## 2021-01-01 DIAGNOSIS — M25552 Pain in left hip: Secondary | ICD-10-CM

## 2021-01-01 DIAGNOSIS — M62838 Other muscle spasm: Secondary | ICD-10-CM

## 2021-01-01 DIAGNOSIS — M545 Low back pain, unspecified: Secondary | ICD-10-CM

## 2021-01-01 NOTE — Therapy (Addendum)
[facility] [address]  [place], [place], [address] Phone: [phone]   Fax:  [phone]  Physical Therapy Treatment / Progress Note / Discharge Summary  Patient Details  Name: Cassie Wilson MRN: [id number] Date of Birth: 1945/11/24 Referring Provider (PT): Gaynelle Arabian, MD  Progress Note  Reporting Period 12/21/2020 to 01/01/2021  See note below for Objective Data and Assessment of Progress/Goals.     Encounter Date: 01/01/2021   PT End of Session - 01/01/21 1530     Visit Number 12    Number of Visits 12    Date for PT Re-Evaluation 01/02/21    Authorization Type Medicare, BCBS, Tricare for Life    PT Start Time 1530    PT Stop Time 1600    PT Time Calculation (min) 30 min    Activity Tolerance Patient tolerated treatment well    Behavior During Therapy WFL for tasks assessed/performed             Past Medical History:  Diagnosis Date   Back pain    Diabetes mellitus without complication ([place])    Gas    Hypertension    Panic attack    Post-menopause     Past Surgical History:  Procedure Laterality Date   neck nerve block     TRIGGER FINGER RELEASE      There were no vitals filed for this visit.   Subjective Assessment - 01/01/21 1534     Subjective Pt reports good relief from DN last visit and notes she was able to go up stairs reciprocally over the weekend which she has not been able to do recently.    Patient Stated Goals "to be able to get up in the morning and stretch out so I can get dressed easier and garden w/o pain"    Pain Score 1     Pain Location Buttocks    Pain Orientation Left    Pain Descriptors / Indicators Discomfort    Pain Type Chronic pain    Pain Frequency Intermittent                [facility] PT Assessment - 01/01/21 1530       Assessment   Medical Diagnosis L sided LBP    Referring Provider (PT) Gaynelle Arabian, MD    Next MD Visit PRN if PT not  helping      Observation/Other Assessments   Focus on Therapeutic Outcomes (FOTO)  Lumbar = 69 (6 point improvement from eval), predicted D/C FS = 70      Strength   Right Hip Flexion 4+/5    Right Hip Extension 4+/5    Right Hip External Rotation  4+/5    Right Hip Internal Rotation 5/5    Right Hip ABduction 5/5    Right Hip ADduction 4+/5    Left Hip Flexion 4+/5    Left Hip Extension 4+/5    Left Hip External Rotation 4+/5    Left Hip Internal Rotation 4+/5    Left Hip ABduction 4/5    Left Hip ADduction 4+/5    Right Knee Flexion 5/5    Right Knee Extension 5/5    Left Knee Flexion 5/5    Left Knee Extension 5/5    Right Ankle Dorsiflexion 5/5    Left Ankle Dorsiflexion 5/5      Palpation   SI assessment  WNL  [facility] PT Treatment/Exercise - 01/01/21 1530       Exercises   Exercises Lumbar      Lumbar Exercises: Aerobic   Nustep L6 x 6 min (LE only)                     PT Education - 01/01/21 1556     Education Details Review of recommended ongoing HEP frequency with taper to maintenance level    Person(s) Educated Patient    Methods Explanation    Comprehension Verbalized understanding              PT Short Term Goals - 11/30/20 1548       PT SHORT TERM GOAL #1   Title Patient will be independent with initial HEP    Status Achieved   11/30/20     PT SHORT TERM GOAL #2   Title Patient will verbalize/demonstrate understanding of neutral spine posture and proper body mechanics to reduce strain on lumbar spine    Status Achieved   11/30/20              PT Long Term Goals - 01/01/21 1537       PT LONG TERM GOAL #1   Title Patient will be independent with ongoing/advanced HEP for self-management at home in order to build upon functional gains in therapy    Status Achieved   01/01/21     PT LONG TERM GOAL #2   Title Patient to report reduction in frequency and intensity of LBP and L  hip pain by >/= 50-75% to allow for improved activity tolerance    Status Achieved   01/01/21 - Pt noting 75% improvement overall, but 100% at SIJ     PT LONG TERM GOAL #3   Title Patient to improve tissue quality as noted by reduced tissue tightness and tenderness to palpation    Status Partially Met   01/01/21 - Still notes occasional feelings of tightness/tenderness but much less frequent     PT LONG TERM GOAL #4   Title Patient will demonstrate improved B proximal LE strength to >/= 4+/5 for improved stability and ease of mobility    Status Achieved   01/01/21 - met except L hip ABD 4/5     PT LONG TERM GOAL #5   Title Patient to report ability to perform ADLs including LB dressing, household tasks, and leisure activities without limitation due to LBP, L hip pain or weakness    Status Partially Met   01/01/21 - able to complete most tasks w/o increased pain but still notes some occasional diffculty trying to place her legs into her underwear or pants in the morning, less of an issue as the day progresses                  Plan - 01/01/21 1549     Clinical Impression Statement Cassie Wilson reports benefit from DN last visit with pain now 75% improved overall and 100% at SIJ. She has demonstrated significant improvement in her strength with overall LE strength now 4+ to 5/5 with only exception being L hip abduction at 4/5. She reports she is now able to complete the majority of her daily tasks w/o limitation due to pain, typically only noting pain when trying to lift her leg into her underwear or pants when getting dressed in the morning but pain usually minimal to nonexistent as the day progresses. All goals now met or partially/mostly met and she  feels ready to try transitioning to her HEP, but would like to remain on hold for 30 days in the event that her SIJ pain would recur, or issues arise with her HEP.    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, trigger finger release    Rehab Potential Good    PT Treatment/Interventions ADLs/Self Care Home Management;Cryotherapy;Electrical Stimulation;Iontophoresis 54m/ml Dexamethasone;Moist Heat;Ultrasound;Gait training;Stair training;Functional mobility training;Therapeutic activities;Therapeutic exercise;Balance training;Neuromuscular re-education;Patient/family education;Manual techniques;Passive range of motion;Dry needling;Taping;Spinal Manipulations;Joint Manipulations    PT Next Visit Plan transition to HEP + 30-day hold    PT Home Exercise Plan Access Code: [email or web address] (10/18, updated 10/20, 10/31 & 11/7)    Consulted and Agree with Plan of Care Patient             Patient will benefit from skilled therapeutic intervention in order to improve the following deficits and impairments:  Abnormal gait, Decreased activity tolerance, Decreased balance, Decreased endurance, Decreased mobility, Impaired flexibility, Decreased strength, Difficulty walking, Increased fascial restricitons, Increased muscle spasms, Impaired perceived functional ability, Improper body mechanics, Postural dysfunction, Pain  Visit Diagnosis: Chronic left-sided low back pain without sciatica  Pain in left hip  Other muscle spasm  Muscle weakness (generalized)  Other symptoms and signs involving the musculoskeletal system     Problem List There are no problems to display for this patient.   JPercival Spanish PT 01/01/2021, 4:10 PM  [facility] [place] [address]Phone: [phone]  Fax:  [phone] Name: KShadiyah WernliMRN: [id number] of Birth: 110-20-1947  PHYSICAL THERAPY DISCHARGE SUMMARY  Visits from Start of Care: 12  Current functional level related to goals / functional outcomes:   Refer to above clinical impression for status as of last visit on  01/01/2021. Patient was placed on hold for 30 days and has not needed to return to PT, therefore will proceed with discharge from PT for this episode.   Remaining deficits:   As above.    Education / Equipment:   HEP   Patient agrees to discharge. Patient goals were mostly met. Patient is being discharged due to being pleased with the current functional level.  JPercival Spanish PT, MPT 02/07/21, 9:58 AM  [facility] [place] [address]Phone: [phone]  Fax:  [phone]

## 2021-01-04 ENCOUNTER — Encounter: Payer: Medicare Other | Admitting: Physical Therapy

## 2021-09-10 ENCOUNTER — Ambulatory Visit (INDEPENDENT_AMBULATORY_CARE_PROVIDER_SITE_OTHER): Payer: Medicare Other | Admitting: Podiatry

## 2021-09-10 DIAGNOSIS — L989 Disorder of the skin and subcutaneous tissue, unspecified: Secondary | ICD-10-CM | POA: Diagnosis not present

## 2021-09-10 NOTE — Progress Notes (Signed)
   Chief Complaint  Patient presents with   Foot Problem    R spot on bottom of foot, possible splinter    HPI: 77 y.o. female presenting today as a new patient for evaluation of a symptomatic skin lesion to the plantar aspect of the right foot.  Patient states that for the last several months she has noticed a small skin callus to the plantar aspect of the foot.  She has not done anything for treatment.  She presents for further treatment and evaluation  Past Medical History:  Diagnosis Date   Back pain    Diabetes mellitus without complication (HCC)    Gas    Hypertension    Panic attack    Post-menopause     Past Surgical History:  Procedure Laterality Date   neck nerve block     TRIGGER FINGER RELEASE      Allergies  Allergen Reactions   Sulfa Antibiotics    Tape      Physical Exam: General: The patient is alert and oriented x3 in no acute distress.  Dermatology: Hyperkeratotic skin lesion/callus with a central nucleated core noted to the plantar aspect of the right foot consistent with a small porokeratosis  Vascular: Palpable pedal pulses bilaterally. Capillary refill within normal limits.  Negative for any significant edema or erythema  Neurological: Light touch and protective threshold grossly intact  Musculoskeletal Exam: No pedal deformities noted  Assessment: 1.  Porokeratosis right foot   Plan of Care:  1. Patient evaluated.  2.  Excisional debridement of the hyperkeratotic porokeratosis was performed using a 312 scalpel without incident or bleeding.   3.  Salicylic acid was applied.  Recommend OTC salicylic acid daily 4.  Return to clinic as needed   Felecia Shelling, DPM [facility]  Dr. Felecia Shelling, DPM    [address] [place], [place] [address]                Office [phone]  Fax [phone]

## 2022-07-20 LAB — HEPATIC FUNCTION PANEL
ALT: 26 U/L (ref 7–35)
AST: 21 (ref 13–35)

## 2022-07-20 LAB — VITAMIN B12: Vitamin B-12: 349

## 2022-07-20 LAB — CBC AND DIFFERENTIAL
Hemoglobin: 13.8 (ref 12.0–16.0)
Platelets: 221 10*3/uL (ref 150–400)
WBC: 8.2

## 2022-07-20 LAB — BASIC METABOLIC PANEL
BUN: 18 (ref 4–21)
CO2: 25 — AB (ref 13–22)
Chloride: 104 (ref 99–108)
Creatinine: 1.2 — AB (ref 0.5–1.1)
Glucose: 130
Potassium: 4 meq/L (ref 3.5–5.1)
Sodium: 142 (ref 137–147)

## 2022-07-20 LAB — COMPREHENSIVE METABOLIC PANEL
Albumin: 3.7 (ref 3.5–5.0)
Calcium: 9.9 (ref 8.7–10.7)

## 2022-07-20 LAB — TSH: TSH: 3.9 (ref 0.41–5.90)

## 2022-07-20 LAB — HEMOGLOBIN A1C: Hemoglobin A1C: 7.9

## 2022-07-20 LAB — CBC: RBC: 4.68 (ref 3.87–5.11)

## 2022-07-20 LAB — VITAMIN D 25 HYDROXY (VIT D DEFICIENCY, FRACTURES): Vit D, 25-Hydroxy: 44.4

## 2023-01-08 LAB — HM DIABETES EYE EXAM

## 2023-01-15 ENCOUNTER — Encounter: Payer: Self-pay | Admitting: Internal Medicine

## 2023-01-15 ENCOUNTER — Ambulatory Visit: Payer: Medicare Other | Admitting: Internal Medicine

## 2023-01-15 VITALS — BP 110/78 | HR 62 | Temp 98.1°F | Ht 65.0 in | Wt 172.9 lb

## 2023-01-15 DIAGNOSIS — Z780 Asymptomatic menopausal state: Secondary | ICD-10-CM | POA: Diagnosis not present

## 2023-01-15 DIAGNOSIS — E1169 Type 2 diabetes mellitus with other specified complication: Secondary | ICD-10-CM | POA: Diagnosis not present

## 2023-01-15 DIAGNOSIS — E785 Hyperlipidemia, unspecified: Secondary | ICD-10-CM

## 2023-01-15 DIAGNOSIS — I10 Essential (primary) hypertension: Secondary | ICD-10-CM | POA: Diagnosis not present

## 2023-01-15 DIAGNOSIS — E119 Type 2 diabetes mellitus without complications: Secondary | ICD-10-CM | POA: Insufficient documentation

## 2023-01-15 DIAGNOSIS — Z1231 Encounter for screening mammogram for malignant neoplasm of breast: Secondary | ICD-10-CM

## 2023-01-15 DIAGNOSIS — M810 Age-related osteoporosis without current pathological fracture: Secondary | ICD-10-CM | POA: Insufficient documentation

## 2023-01-15 LAB — POCT GLYCOSYLATED HEMOGLOBIN (HGB A1C): Hemoglobin A1C: 7.6 % — AB (ref 4.0–5.6)

## 2023-01-15 MED ORDER — TIRZEPATIDE 2.5 MG/0.5ML ~~LOC~~ SOAJ: 2.5000 mg | SUBCUTANEOUS | 0 refills | Status: DC

## 2023-01-15 NOTE — Assessment & Plan Note (Signed)
Uncontrolled with an A1c of 7.6, continue Jardiance and metformin.  Add Mounjaro 2.5 mg weekly, return in 3 months for follow-up.  Lifestyle changes enforced.

## 2023-01-15 NOTE — Addendum Note (Signed)
Addended by: Kern Reap B on: 01/15/2023 03:44 PM   Modules accepted: Orders

## 2023-01-15 NOTE — Assessment & Plan Note (Signed)
Continue rosuvastatin, check lipids today.

## 2023-01-15 NOTE — Assessment & Plan Note (Signed)
Due for updated DEXA.

## 2023-01-15 NOTE — Progress Notes (Signed)
New patient Office Visit     CC/Reason for Visit: Establish care, follow-up chronic medical conditions  HPI: Cassie Wilson is a 77 y.o. female who is coming in today for the above mentioned reasons. Past Medical History is significant for: Type 2 diabetes, hypertension, hyperlipidemia, osteoporosis.  Her previous PCP used to be in [place].  She does not smoke or drink, has allergies to sulfa drugs which cause hives.  She is overdue for mammogram and bone density.  She is also due for her repeat colonoscopy.   Past Medical/Surgical History: Past Medical History:  Diagnosis Date   Back pain    Diabetes mellitus without complication (HCC)    DM (diabetes mellitus), type 2 (HCC)    Gas    Hyperlipidemia associated with type 2 diabetes mellitus (HCC)    Hypertension    Osteoporosis    Panic attack    Post-menopause     Past Surgical History:  Procedure Laterality Date   neck nerve block     TRIGGER FINGER RELEASE      Social History:  reports that she has never smoked. She does not have any smokeless tobacco history on file. She reports that she does not drink alcohol and does not use drugs.  Allergies: Allergies  Allergen Reactions   Other     Shell fish - gi upset   Sulfa Antibiotics    Tape    Prednisone Palpitations    Family History:  Family History  Problem Relation Age of Onset   Heart failure Mother    CAD Father      Current Outpatient Medications:    ALPRAZolam (XANAX XR) 0.5 MG 24 hr tablet, Take 0.5 mg by mouth daily as needed for anxiety., Disp: , Rfl:    atenolol (TENORMIN) 50 MG tablet, Take 50 mg by mouth daily., Disp: , Rfl:    azelastine (ASTELIN) 0.1 % nasal spray, Place 2 sprays into both nostrils daily. Use in each nostril as directed, Disp: , Rfl:    benazepril-hydrochlorthiazide (LOTENSIN HCT) 5-6.25 MG tablet, Take 1 tablet by mouth daily., Disp: , Rfl:    Calcium Carbonate-Vit D-Min (CALCIUM 1200) 1200-1000 MG-UNIT CHEW, Chew  1 tablet by mouth daily., Disp: , Rfl:    cyclobenzaprine (FLEXERIL) 10 MG tablet, Take 10 mg by mouth 3 (three) times daily as needed for muscle spasms., Disp: , Rfl:    dutasteride (AVODART) 0.5 MG capsule, Take 0.5 mg by mouth daily., Disp: , Rfl:    empagliflozin (JARDIANCE) 10 MG TABS tablet, Take 10 mg by mouth daily., Disp: , Rfl:    famotidine (PEPCID) 20 MG tablet, Take 20 mg by mouth 2 (two) times daily., Disp: , Rfl:    hydrochlorothiazide (HYDRODIURIL) 25 MG tablet, Take 25 mg by mouth daily., Disp: , Rfl:    losartan (COZAAR) 100 MG tablet, Take 100 mg by mouth daily., Disp: , Rfl:    metFORMIN (GLUCOPHAGE) 500 MG tablet, Take 1,000 mg by mouth 2 (two) times daily with a meal., Disp: , Rfl:    montelukast (SINGULAIR) 10 MG tablet, Take 10 mg by mouth at bedtime., Disp: , Rfl:    Multiple Vitamins-Minerals (CENTRUM ADULTS PO), Take 1 tablet by mouth., Disp: , Rfl:    potassium chloride (KLOR-CON) 20 MEQ packet, Take 20 mEq by mouth 2 (two) times daily., Disp: , Rfl:    raloxifene (EVISTA) 60 MG tablet, Take 60 mg by mouth daily., Disp: , Rfl:    rosuvastatin (CRESTOR)  20 MG tablet, Take 20 mg by mouth daily., Disp: , Rfl:    simethicone (MYLICON) 125 MG chewable tablet, Chew 125 mg by mouth every 6 (six) hours as needed for flatulence., Disp: , Rfl:    tirzepatide (MOUNJARO) 2.5 MG/0.5ML Pen, Inject 2.5 mg into the skin once a week., Disp: 2 mL, Rfl: 0  Review of Systems:  Negative unless indicated in HPI.   Physical Exam: Vitals:   01/15/23 1405  BP: 110/78  Pulse: 62  Temp: 98.1 F (36.7 C)  TempSrc: Oral  SpO2: 98%  Weight: 172 lb 14.4 oz (78.4 kg)  Height: 5\' 5"  (1.651 m)    Body mass index is 28.77 kg/m.   Physical Exam   Impression and Plan:  Type 2 diabetes mellitus with other specified complication, without long-term current use of insulin (HCC) Assessment & Plan: Uncontrolled with an A1c of 7.6, continue Jardiance and metformin.  Add Mounjaro 2.5 mg  weekly, return in 3 months for follow-up.  Lifestyle changes enforced.  Orders: -     POCT glycosylated hemoglobin (Hb A1C) -     Microalbumin / creatinine urine ratio; Future -     CBC with Differential/Platelet; Future -     Comprehensive metabolic panel; Future -     Lipid panel; Future -     Tirzepatide; Inject 2.5 mg into the skin once a week.  Dispense: 2 mL; Refill: 0  Primary hypertension Assessment & Plan: Well-controlled on current.   Hyperlipidemia associated with type 2 diabetes mellitus (HCC) Assessment & Plan: Continue rosuvastatin, check lipids today.   Age-related osteoporosis without current pathological fracture Assessment & Plan: Due for updated DEXA.   Postmenopausal estrogen deficiency  Screening mammogram for breast cancer  -Orders placed for screening mammogram and DEXA scan.   Time spent:46 minutes reviewing chart, interviewing and examining patient and formulating plan of care.     Chaya Jan, MD [facility]

## 2023-01-15 NOTE — Assessment & Plan Note (Signed)
Well controlled on current

## 2023-01-16 LAB — CBC WITH DIFFERENTIAL/PLATELET
Basophils Absolute: 0.1 10*3/uL (ref 0.0–0.1)
Basophils Relative: 1.2 % (ref 0.0–3.0)
Eosinophils Absolute: 0.1 10*3/uL (ref 0.0–0.7)
Eosinophils Relative: 0.9 % (ref 0.0–5.0)
HCT: 42.5 % (ref 36.0–46.0)
Hemoglobin: 14.4 g/dL (ref 12.0–15.0)
Lymphocytes Relative: 21 % (ref 12.0–46.0)
Lymphs Abs: 2.2 10*3/uL (ref 0.7–4.0)
MCHC: 34 g/dL (ref 30.0–36.0)
MCV: 88.8 fL (ref 78.0–100.0)
Monocytes Absolute: 0.6 10*3/uL (ref 0.1–1.0)
Monocytes Relative: 6 % (ref 3.0–12.0)
Neutro Abs: 7.5 10*3/uL (ref 1.4–7.7)
Neutrophils Relative %: 70.9 % (ref 43.0–77.0)
Platelets: 260 10*3/uL (ref 150.0–400.0)
RBC: 4.79 Mil/uL (ref 3.87–5.11)
RDW: 13.6 % (ref 11.5–15.5)
WBC: 10.5 10*3/uL (ref 4.0–10.5)

## 2023-01-16 LAB — MICROALBUMIN / CREATININE URINE RATIO
Creatinine,U: 103.9 mg/dL
Microalb Creat Ratio: 0.7 mg/g (ref 0.0–30.0)
Microalb, Ur: <0.7 mg/dL (ref 0.0–1.9)

## 2023-01-16 LAB — LIPID PANEL
Cholesterol: 109 mg/dL (ref 0–200)
HDL: 45.3 mg/dL (ref >39.00)
LDL Cholesterol: 33 mg/dL (ref 0–99)
NonHDL: 64.05
Total CHOL/HDL Ratio: 2
Triglycerides: 157 mg/dL — ABNORMAL HIGH (ref 0.0–149.0)
VLDL: 31.4 mg/dL (ref 0.0–40.0)

## 2023-01-16 LAB — COMPREHENSIVE METABOLIC PANEL
ALT: 17 U/L (ref 0–35)
AST: 23 U/L (ref 0–37)
Albumin: 4.4 g/dL (ref 3.5–5.2)
Alkaline Phosphatase: 65 U/L (ref 39–117)
BUN: 30 mg/dL — ABNORMAL HIGH (ref 6–23)
CO2: 26 meq/L (ref 19–32)
Calcium: 10.2 mg/dL (ref 8.4–10.5)
Chloride: 101 meq/L (ref 96–112)
Creatinine, Ser: 1.36 mg/dL — ABNORMAL HIGH (ref 0.40–1.20)
GFR: 37.66 mL/min — ABNORMAL LOW (ref >60.00)
Glucose, Bld: 117 mg/dL — ABNORMAL HIGH (ref 70–99)
Potassium: 4.1 meq/L (ref 3.5–5.1)
Sodium: 139 meq/L (ref 135–145)
Total Bilirubin: 1 mg/dL (ref 0.2–1.2)
Total Protein: 7 g/dL (ref 6.0–8.3)

## 2023-01-21 ENCOUNTER — Other Ambulatory Visit: Payer: Self-pay | Admitting: *Deleted

## 2023-01-21 DIAGNOSIS — R944 Abnormal results of kidney function studies: Secondary | ICD-10-CM

## 2023-02-04 ENCOUNTER — Other Ambulatory Visit (INDEPENDENT_AMBULATORY_CARE_PROVIDER_SITE_OTHER): Payer: Medicare Other

## 2023-02-04 DIAGNOSIS — R944 Abnormal results of kidney function studies: Secondary | ICD-10-CM | POA: Diagnosis not present

## 2023-02-04 LAB — BASIC METABOLIC PANEL
BUN: 23 mg/dL (ref 6–23)
CO2: 28 meq/L (ref 19–32)
Calcium: 9.7 mg/dL (ref 8.4–10.5)
Chloride: 99 meq/L (ref 96–112)
Creatinine, Ser: 1.51 mg/dL — ABNORMAL HIGH (ref 0.40–1.20)
GFR: 33.21 mL/min — ABNORMAL LOW (ref >60.00)
Glucose, Bld: 149 mg/dL — ABNORMAL HIGH (ref 70–99)
Potassium: 3.9 meq/L (ref 3.5–5.1)
Sodium: 139 meq/L (ref 135–145)

## 2023-02-07 ENCOUNTER — Telehealth: Payer: Self-pay | Admitting: *Deleted

## 2023-02-07 ENCOUNTER — Encounter: Payer: Self-pay | Admitting: Internal Medicine

## 2023-02-07 DIAGNOSIS — R944 Abnormal results of kidney function studies: Secondary | ICD-10-CM

## 2023-02-07 NOTE — Telephone Encounter (Signed)
 Copied from CRM [id number]. Topic: Clinical - Medication Question >> Feb 07, 2023 11:40 AM Thersia BROCKS wrote: Reason for CRM: Patient daughter called in about medication that was order to stop taking benazepril-hydrochlorthiazide , would like to know if she needs to stop taking both since she prescribe that mediation separately . Daughter is requesting a callback at [phone]

## 2023-02-07 NOTE — Telephone Encounter (Signed)
 Per provider's note: Kidney function is worse than prior. Please have her stop hydrochlorothiazide and losartan. Increase oral fluid consumption and return in 2 weeks for repeat BMP.   Spoke to pt. Relay message to pt.   Pt verbalized understanding and states she already has appt schedule on 02/17/2022 for lab recheck.   BMP lab order is placed.    Pt states she doesn't has blood glucose check at home. Pt requests if provider can send in one along with test strips and lancets.  Please advise.

## 2023-02-07 NOTE — Telephone Encounter (Unsigned)
 Copied from CRM [id number]. Topic: Clinical - Medication Question >> Feb 07, 2023  3:13 PM Sonny Dandy B wrote: Reason for CRM: Pt's daugther called again to follow up on msg regarding  her mothers medication. Requesting a call back [phone]

## 2023-02-11 ENCOUNTER — Telehealth: Payer: Self-pay

## 2023-02-11 ENCOUNTER — Ambulatory Visit: Payer: Medicare Other | Admitting: Internal Medicine

## 2023-02-11 MED ORDER — LANCETS MISC. MISC: 1.0000 | Freq: Three times a day (TID) | 0 refills | Status: AC

## 2023-02-11 MED ORDER — LANCET DEVICE MISC: 1.0000 | Freq: Three times a day (TID) | 0 refills | Status: AC

## 2023-02-11 MED ORDER — BLOOD GLUCOSE MONITORING SUPPL DEVI: 1.0000 | Freq: Three times a day (TID) | 0 refills | Status: AC

## 2023-02-11 MED ORDER — BLOOD GLUCOSE TEST VI STRP: 1.0000 | ORAL_STRIP | Freq: Three times a day (TID) | 0 refills | Status: AC

## 2023-02-11 NOTE — Addendum Note (Signed)
 Addended by: Kern Reap B on: 02/11/2023 02:10 PM   Modules accepted: Orders

## 2023-02-11 NOTE — Telephone Encounter (Signed)
 Copied from CRM [id number]. Topic: Clinical - Medication Question >> Feb 11, 2023  1:56 PM Adaysia C wrote: Reason for CRM: Patient would like for the provider to give her a call back about her blood pressure medication change because her blood pressure it still high. Please follow up with patient by calling her mobile phone#[phone], if she does not answer please call her daughter instead #[phone] but patient has requested for provider not to leave voicemail on her home phone number.

## 2023-02-11 NOTE — Telephone Encounter (Signed)
 Rx for glucometer sent

## 2023-02-13 LAB — HM DEXA SCAN

## 2023-02-14 ENCOUNTER — Encounter: Payer: Self-pay | Admitting: Internal Medicine

## 2023-02-17 ENCOUNTER — Encounter: Payer: Self-pay | Admitting: Internal Medicine

## 2023-02-18 ENCOUNTER — Ambulatory Visit (INDEPENDENT_AMBULATORY_CARE_PROVIDER_SITE_OTHER): Payer: Medicare Other | Admitting: Internal Medicine

## 2023-02-18 ENCOUNTER — Encounter: Payer: Self-pay | Admitting: Internal Medicine

## 2023-02-18 ENCOUNTER — Other Ambulatory Visit: Payer: Medicare Other

## 2023-02-18 VITALS — BP 135/80 | HR 70 | Temp 98.1°F | Wt 167.4 lb

## 2023-02-18 DIAGNOSIS — R944 Abnormal results of kidney function studies: Secondary | ICD-10-CM

## 2023-02-18 DIAGNOSIS — E1169 Type 2 diabetes mellitus with other specified complication: Secondary | ICD-10-CM

## 2023-02-18 DIAGNOSIS — N170 Acute kidney failure with tubular necrosis: Secondary | ICD-10-CM | POA: Diagnosis not present

## 2023-02-18 DIAGNOSIS — Z7984 Long term (current) use of oral hypoglycemic drugs: Secondary | ICD-10-CM

## 2023-02-18 DIAGNOSIS — I10 Essential (primary) hypertension: Secondary | ICD-10-CM | POA: Diagnosis not present

## 2023-02-18 LAB — BASIC METABOLIC PANEL
BUN: 18 mg/dL (ref 6–23)
CO2: 23 meq/L (ref 19–32)
Calcium: 10.4 mg/dL (ref 8.4–10.5)
Chloride: 105 meq/L (ref 96–112)
Creatinine, Ser: 1.33 mg/dL — ABNORMAL HIGH (ref 0.40–1.20)
GFR: 38.66 mL/min — ABNORMAL LOW (ref >60.00)
Glucose, Bld: 124 mg/dL — ABNORMAL HIGH (ref 70–99)
Potassium: 3.8 meq/L (ref 3.5–5.1)
Sodium: 140 meq/L (ref 135–145)

## 2023-02-18 MED ORDER — AMLODIPINE BESYLATE 5 MG PO TABS: 5.0000 mg | ORAL_TABLET | Freq: Every day | ORAL | 1 refills | Status: DC

## 2023-02-18 NOTE — Progress Notes (Signed)
 Established Patient Office Visit     CC/Reason for Visit: Elevated blood pressure and kidney function  HPI: Cassie Wilson is a 78 y.o. female who is coming in today for the above mentioned reasons.  After recent labs she was asked to hold losartan and hydrochlorothiazide due to elevated creatinine of 1.510 with a GFR of 33.  She has noted slow increase of blood pressure to a high of 155/99 at home and here to discuss today.  She is also due to recheck kidney labs today.  She has been having issues tolerating Mounjaro  same as she did with Ozempic.  She is having vomiting sometimes 5 days into her last injection.   Past Medical/Surgical History: Past Medical History:  Diagnosis Date   Back pain    Diabetes mellitus without complication (HCC)    DM (diabetes mellitus), type 2 (HCC)    Gas    Hyperlipidemia associated with type 2 diabetes mellitus (HCC)    Hypertension    Osteoporosis    Panic attack    Post-menopause     Past Surgical History:  Procedure Laterality Date   neck nerve block     TRIGGER FINGER RELEASE      Social History:  reports that she has never smoked. She does not have any smokeless tobacco history on file. She reports that she does not drink alcohol and does not use drugs.  Allergies: Allergies  Allergen Reactions   Other     Shell fish - gi upset   Sulfa Antibiotics    Tape    Prednisone Palpitations    Family History:  Family History  Problem Relation Age of Onset   Heart failure Mother    CAD Father      Current Outpatient Medications:    ALPRAZolam (XANAX XR) 0.5 MG 24 hr tablet, Take 0.5 mg by mouth daily as needed for anxiety., Disp: , Rfl:    amLODipine  (NORVASC ) 5 MG tablet, Take 1 tablet (5 mg total) by mouth daily., Disp: 90 tablet, Rfl: 1   atenolol (TENORMIN) 50 MG tablet, Take 50 mg by mouth daily., Disp: , Rfl:    azelastine (ASTELIN) 0.1 % nasal spray, Place 2 sprays into both nostrils daily. Use in each nostril as  directed, Disp: , Rfl:    Blood Glucose Monitoring Suppl DEVI, 1 each by Does not apply route in the morning, at noon, and at bedtime. May substitute to any manufacturer covered by patient's insurance., Disp: 1 each, Rfl: 0   Calcium Carbonate-Vit D-Min (CALCIUM 1200) 1200-1000 MG-UNIT CHEW, Chew 1 tablet by mouth daily., Disp: , Rfl:    cyclobenzaprine (FLEXERIL) 10 MG tablet, Take 10 mg by mouth 3 (three) times daily as needed for muscle spasms., Disp: , Rfl:    dutasteride (AVODART) 0.5 MG capsule, Take 0.5 mg by mouth daily., Disp: , Rfl:    empagliflozin  (JARDIANCE ) 10 MG TABS tablet, Take 10 mg by mouth daily., Disp: , Rfl:    famotidine (PEPCID) 20 MG tablet, Take 20 mg by mouth 2 (two) times daily., Disp: , Rfl:    Glucose Blood (BLOOD GLUCOSE TEST STRIPS) STRP, 1 each by In Vitro route in the morning, at noon, and at bedtime. May substitute to any manufacturer covered by patient's insurance., Disp: 100 strip, Rfl: 0   Lancet Device MISC, 1 each by Does not apply route in the morning, at noon, and at bedtime. May substitute to any manufacturer covered by patient's insurance., Disp: 1  each, Rfl: 0   Lancets Misc. MISC, 1 each by Does not apply route in the morning, at noon, and at bedtime. May substitute to any manufacturer covered by patient's insurance., Disp: 100 each, Rfl: 0   metFORMIN  (GLUCOPHAGE ) 500 MG tablet, Take 1,000 mg by mouth 2 (two) times daily with a meal., Disp: , Rfl:    montelukast (SINGULAIR) 10 MG tablet, Take 10 mg by mouth at bedtime., Disp: , Rfl:    Multiple Vitamins-Minerals (CENTRUM ADULTS PO), Take 1 tablet by mouth., Disp: , Rfl:    potassium chloride (KLOR-CON) 20 MEQ packet, Take 20 mEq by mouth 2 (two) times daily., Disp: , Rfl:    raloxifene (EVISTA) 60 MG tablet, Take 60 mg by mouth daily., Disp: , Rfl:    rosuvastatin (CRESTOR) 20 MG tablet, Take 20 mg by mouth daily., Disp: , Rfl:    simethicone (MYLICON) 125 MG chewable tablet, Chew 125 mg by mouth every 6  (six) hours as needed for flatulence., Disp: , Rfl:   Review of Systems:  Negative unless indicated in HPI.   Physical Exam: Vitals:   02/18/23 0940 02/18/23 1014  BP: 133/64 135/80  Pulse: 70   Temp: 98.1 F (36.7 C)   TempSrc: Oral   SpO2: 99%   Weight: 167 lb 6.4 oz (75.9 kg)     Body mass index is 27.86 kg/m.   Physical Exam Vitals reviewed.  Constitutional:      Appearance: Normal appearance.  HENT:     Head: Normocephalic and atraumatic.  Eyes:     Conjunctiva/sclera: Conjunctivae normal.  Cardiovascular:     Rate and Rhythm: Normal rate and regular rhythm.  Pulmonary:     Effort: Pulmonary effort is normal.     Breath sounds: Normal breath sounds.  Skin:    General: Skin is warm and dry.  Neurological:     General: No focal deficit present.     Mental Status: She is alert and oriented to person, place, and time.  Psychiatric:        Mood and Affect: Mood normal.        Behavior: Behavior normal.        Thought Content: Thought content normal.        Judgment: Judgment normal.      Impression and Plan:  Primary hypertension -     amLODIPine  Besylate; Take 1 tablet (5 mg total) by mouth daily.  Dispense: 90 tablet; Refill: 1  Type 2 diabetes mellitus with other specified complication, without long-term current use of insulin (HCC) -     Ambulatory referral to Endocrinology  Acute renal failure with tubular necrosis (HCC)   -Recheck kidney function today to see if improved off nephrotoxic agents. -Since blood pressure increasing we will add amlodipine  5 mg daily, she already has a follow-up appointment in March at which time we will recheck.  In the meantime she will continue ambulatory blood pressure measurements. -She is having vomiting with Mounjaro , same thing happen with Ozempic.  I will ask her to discontinue.  She is already on maximum dose metformin  also on Jardiance .  I will place referral to endocrinology as she may need to go on  insulin.  Time spent:31 minutes reviewing chart, interviewing and examining patient and formulating plan of care.     Tully Theophilus Andrews, MD [facility]

## 2023-02-19 ENCOUNTER — Other Ambulatory Visit: Payer: Self-pay | Admitting: Internal Medicine

## 2023-02-19 ENCOUNTER — Encounter: Payer: Self-pay | Admitting: Internal Medicine

## 2023-02-19 DIAGNOSIS — N1832 Chronic kidney disease, stage 3b: Secondary | ICD-10-CM

## 2023-02-19 DIAGNOSIS — N183 Chronic kidney disease, stage 3 unspecified: Secondary | ICD-10-CM | POA: Insufficient documentation

## 2023-02-20 ENCOUNTER — Encounter: Payer: Self-pay | Admitting: Internal Medicine

## 2023-02-21 ENCOUNTER — Other Ambulatory Visit: Payer: Self-pay | Admitting: Internal Medicine

## 2023-02-27 ENCOUNTER — Encounter (INDEPENDENT_AMBULATORY_CARE_PROVIDER_SITE_OTHER): Payer: Medicare Other | Admitting: Family Medicine

## 2023-02-27 NOTE — Progress Notes (Signed)
 error

## 2023-03-03 ENCOUNTER — Other Ambulatory Visit: Payer: Self-pay | Admitting: Internal Medicine

## 2023-03-07 ENCOUNTER — Other Ambulatory Visit: Payer: Self-pay | Admitting: Internal Medicine

## 2023-03-20 ENCOUNTER — Encounter: Payer: Self-pay | Admitting: Internal Medicine

## 2023-04-15 ENCOUNTER — Ambulatory Visit (INDEPENDENT_AMBULATORY_CARE_PROVIDER_SITE_OTHER): Payer: Medicare Other | Admitting: Internal Medicine

## 2023-04-15 VITALS — BP 120/78 | HR 64 | Temp 97.6°F | Wt 167.0 lb

## 2023-04-15 DIAGNOSIS — E1169 Type 2 diabetes mellitus with other specified complication: Secondary | ICD-10-CM | POA: Diagnosis not present

## 2023-04-15 DIAGNOSIS — Z7984 Long term (current) use of oral hypoglycemic drugs: Secondary | ICD-10-CM

## 2023-04-15 DIAGNOSIS — N1831 Chronic kidney disease, stage 3a: Secondary | ICD-10-CM | POA: Diagnosis not present

## 2023-04-15 DIAGNOSIS — E785 Hyperlipidemia, unspecified: Secondary | ICD-10-CM

## 2023-04-15 DIAGNOSIS — I10 Essential (primary) hypertension: Secondary | ICD-10-CM

## 2023-04-15 DIAGNOSIS — M810 Age-related osteoporosis without current pathological fracture: Secondary | ICD-10-CM

## 2023-04-15 LAB — POCT GLYCOSYLATED HEMOGLOBIN (HGB A1C): Hemoglobin A1C: 6.1 % — AB (ref 4.0–5.6)

## 2023-04-15 NOTE — Assessment & Plan Note (Signed)
On Evista 

## 2023-04-15 NOTE — Assessment & Plan Note (Signed)
 Followed by nephrology.  Baseline creatinine around 1.3-1.4.

## 2023-04-15 NOTE — Progress Notes (Signed)
 Established Patient Office Visit     CC/Reason for Visit: Follow-up chronic medical conditions  HPI: Cassie Wilson is a 78 y.o. female who is coming in today for the above mentioned reasons. Past Medical History is significant for: Hypertension, hyperlipidemia, type 2 diabetes and osteoporosis.  She has been feeling well.   Past Medical/Surgical History: Past Medical History:  Diagnosis Date   Back pain    Diabetes mellitus without complication (HCC)    DM (diabetes mellitus), type 2 (HCC)    Gas    Hyperlipidemia associated with type 2 diabetes mellitus (HCC)    Hypertension    Osteoporosis    Panic attack    Post-menopause     Past Surgical History:  Procedure Laterality Date   neck nerve block     TRIGGER FINGER RELEASE      Social History:  reports that she has never smoked. She does not have any smokeless tobacco history on file. She reports that she does not drink alcohol and does not use drugs.  Allergies: Allergies  Allergen Reactions   Other     Shell fish - gi upset   Sulfa Antibiotics    Tape    Prednisone Palpitations    Family History:  Family History  Problem Relation Age of Onset   Heart failure Mother    CAD Father      Current Outpatient Medications:    ALPRAZolam (XANAX XR) 0.5 MG 24 hr tablet, Take 0.5 mg by mouth daily as needed for anxiety., Disp: , Rfl:    amLODipine (NORVASC) 5 MG tablet, Take 1 tablet (5 mg total) by mouth daily., Disp: 90 tablet, Rfl: 1   atenolol (TENORMIN) 50 MG tablet, Take 50 mg by mouth daily., Disp: , Rfl:    azelastine (ASTELIN) 0.1 % nasal spray, Place 2 sprays into both nostrils daily. Use in each nostril as directed, Disp: , Rfl:    Blood Glucose Monitoring Suppl DEVI, 1 each by Does not apply route in the morning, at noon, and at bedtime. May substitute to any manufacturer covered by patient's insurance., Disp: 1 each, Rfl: 0   Calcium Carbonate-Vitamin D (CALCIUM-VITAMIN D PO), Take by mouth.  Take 1 cap a day, Disp: , Rfl:    cyclobenzaprine (FLEXERIL) 10 MG tablet, Take 10 mg by mouth 3 (three) times daily as needed for muscle spasms., Disp: , Rfl:    dutasteride (AVODART) 0.5 MG capsule, Take 0.5 mg by mouth daily., Disp: , Rfl:    empagliflozin (JARDIANCE) 10 MG TABS tablet, TAKE ONE TABLET BY MOUTH EVERY DAY, Disp: 90 tablet, Rfl: 1   famotidine (PEPCID) 20 MG tablet, Take 20 mg by mouth 2 (two) times daily., Disp: , Rfl:    metFORMIN (GLUCOPHAGE) 500 MG tablet, TAKE TWO TABLETS BY MOUTH TWICE DAILY, Disp: 360 tablet, Rfl: 1   montelukast (SINGULAIR) 10 MG tablet, Take 10 mg by mouth as needed., Disp: , Rfl:    Multiple Vitamins-Minerals (CENTRUM ADULTS PO), Take 1 tablet by mouth., Disp: , Rfl:    potassium chloride (KLOR-CON) 20 MEQ packet, Take 20 mEq by mouth 2 (two) times daily., Disp: , Rfl:    raloxifene (EVISTA) 60 MG tablet, TAKE ONE (1) TABLET BY MOUTH EVERY DAY, Disp: 90 tablet, Rfl: 1   rosuvastatin (CRESTOR) 20 MG tablet, TAKE 1 TABLET BY MOUTH EVERY EVENING, Disp: 90 tablet, Rfl: 1   Simethicone (GAS-X PO), Take by mouth., Disp: , Rfl:    losartan (COZAAR)  100 MG tablet, TAKE ONE (1) TABLET BY MOUTH EVERY DAY, Disp: 90 tablet, Rfl: 1  Review of Systems:  Negative unless indicated in HPI.   Physical Exam: Vitals:   04/15/23 0925  BP: 120/78  Pulse: 64  Temp: 97.6 F (36.4 C)  TempSrc: Oral  SpO2: 98%  Weight: 167 lb (75.8 kg)    Body mass index is 27.79 kg/m.   Physical Exam Vitals reviewed.  Constitutional:      Appearance: Normal appearance.  HENT:     Head: Normocephalic and atraumatic.  Eyes:     Conjunctiva/sclera: Conjunctivae normal.  Cardiovascular:     Rate and Rhythm: Normal rate and regular rhythm.  Pulmonary:     Effort: Pulmonary effort is normal.     Breath sounds: Normal breath sounds.  Skin:    General: Skin is warm and dry.  Neurological:     General: No focal deficit present.     Mental Status: She is alert and oriented  to person, place, and time.  Psychiatric:        Mood and Affect: Mood normal.        Behavior: Behavior normal.        Thought Content: Thought content normal.        Judgment: Judgment normal.     Impression and Plan:  Type 2 diabetes mellitus with other specified complication, without long-term current use of insulin (HCC) Assessment & Plan: Well-controlled with an A1c of 6.1 continue Jardiance and metformin.  She has her first appointment with endocrinology scheduled for April.  Orders: -     POCT glycosylated hemoglobin (Hb A1C)  Primary hypertension Assessment & Plan: Blood pressure is well-controlled on current.   Hyperlipidemia associated with type 2 diabetes mellitus (HCC) Assessment & Plan: Continue rosuvastatin, LDL at goal at 33.   Stage 3a chronic kidney disease (HCC) Assessment & Plan: Followed by nephrology.  Baseline creatinine around 1.3-1.4.   Age-related osteoporosis without current pathological fracture Assessment & Plan: On Evista.      Time spent:32 minutes reviewing chart, interviewing and examining patient and formulating plan of care.     Chaya Jan, MD [facility]

## 2023-04-15 NOTE — Assessment & Plan Note (Signed)
 Well-controlled with an A1c of 6.1 continue Jardiance and metformin.  She has her first appointment with endocrinology scheduled for April.

## 2023-04-15 NOTE — Assessment & Plan Note (Signed)
 Blood pressure is well-controlled on current.

## 2023-04-15 NOTE — Assessment & Plan Note (Signed)
 Continue rosuvastatin, LDL at goal at 33.

## 2023-05-09 ENCOUNTER — Other Ambulatory Visit: Payer: Self-pay | Admitting: Internal Medicine

## 2023-06-03 ENCOUNTER — Ambulatory Visit (INDEPENDENT_AMBULATORY_CARE_PROVIDER_SITE_OTHER): Payer: Medicare Other | Admitting: Endocrinology

## 2023-06-03 ENCOUNTER — Encounter: Payer: Self-pay | Admitting: Endocrinology

## 2023-06-03 VITALS — BP 136/80 | HR 62 | Resp 20 | Ht 65.0 in | Wt 165.2 lb

## 2023-06-03 DIAGNOSIS — Z7984 Long term (current) use of oral hypoglycemic drugs: Secondary | ICD-10-CM | POA: Diagnosis not present

## 2023-06-03 DIAGNOSIS — E1169 Type 2 diabetes mellitus with other specified complication: Secondary | ICD-10-CM | POA: Diagnosis not present

## 2023-06-03 MED ORDER — EMPAGLIFLOZIN 25 MG PO TABS: 25.0000 mg | ORAL_TABLET | Freq: Every day | ORAL | 3 refills | Status: AC

## 2023-06-03 MED ORDER — METFORMIN HCL ER 500 MG PO TB24: 500.0000 mg | ORAL_TABLET | Freq: Two times a day (BID) | ORAL | 3 refills | Status: AC

## 2023-06-03 NOTE — Patient Instructions (Signed)
 Diabetes regimen:  Change metformin to XR 5000 mg 1 tab daily, Increase jardiance to 25 mg daily.  Check glucose in the morning fasting and at bedtime and bring glucometer in follow up visit.

## 2023-06-03 NOTE — Progress Notes (Signed)
 Outpatient Endocrinology Note Cassie Valta Dillon, MD   Patient's Name: Cassie Wilson    DOB: [birth year]    MRN: [id number]                                                    REASON OF VISIT: New consult for type 2 diabetes mellitus  REFERRING PROVIDER: Zilphia Hilt, Charyl Coppersmith, MD  PCP: Zilphia Hilt, Charyl Coppersmith, MD  HISTORY OF PRESENT ILLNESS:   Cassie Wilson is a 78 y.o. old female with past medical history listed below, is here for new consult for type 2 diabetes mellitus.   Pertinent Diabetes History: Patient was referred to otology for evaluation and management of type 2 diabetes mellitus.  Patient was diagnosed with type 2 diabetes mellitus in June 2024 at that time hemoglobin A1c was 7.9%.  Patient states she was prediabetic for several years and was taking metformin prior to diagnosis of type 2 diabetes mellitus.  She had hemoglobin A1c of 7.6% in December 2024 and improved to 6.1% in March 2025.  Patient had taken Ozempic and Mounjaro in the past with GI intolerance with nausea and vomiting and is stopped.    History of DKA or diabetes related hospitalizations: none  Chronic Diabetes Complications : Retinopathy: no. Last ophthalmology exam was done on 01/2023, following with ophthalmology regularly.  Nephropathy: CKD IIIb, was on losartan, stopped later.  On SGLT2 inhibitor/Jardiance.  Following with nephrology. Peripheral neuropathy: no Coronary artery disease: no Stroke: no  Relevant comorbidities and cardiovascular risk factors: Obesity: no Body mass index is 27.49 kg/m.  Hypertension: Yes  Hyperlipidemia : Yes, on statin   Current / Home Diabetic regimen includes:  Metformin 500 mg 2 tablets 2 times a day. Jardiance 10 mg daily.  Prior diabetic medications: Ozempic and Mounjaro taken in the past stopped due to GI intolerance.  Glycemic data:   He has glucometer and test supplies however has not been checking blood sugar at  home.  Hypoglycemia: Patient has no hypoglycemic episodes. Patient has hypoglycemia awareness.  Factors modifying glucose control: 1.  Diabetic diet assessment: 3 meals a day.  2.  Staying active or exercising:   3.  Medication compliance: compliant all of the time.  # Osteoporosis, on Evista, managed by PCP.  Interval history  Patient presented to establish diabetes care.  Diabetes regimen as reviewed above.  She has improvement on diabetes control with hemoglobin A1c of 6.1% in March.  Denies numbness and tingling of the feet.  No vision problem.  No other complaints today.  REVIEW OF SYSTEMS As per history of present illness.   PAST MEDICAL HISTORY: Past Medical History:  Diagnosis Date   Back pain    Diabetes mellitus without complication (HCC)    DM (diabetes mellitus), type 2 (HCC)    Gas    Hyperlipidemia associated with type 2 diabetes mellitus (HCC)    Hypertension    Osteoporosis    Panic attack    Post-menopause     PAST SURGICAL HISTORY: Past Surgical History:  Procedure Laterality Date   neck nerve block     TRIGGER FINGER RELEASE      ALLERGIES: Allergies  Allergen Reactions   Other     Shell fish - gi upset   Sulfa Antibiotics    Tape    Prednisone Palpitations  FAMILY HISTORY:  Family History  Problem Relation Age of Onset   Heart failure Mother    CAD Father     SOCIAL HISTORY: Social History   Socioeconomic History   Marital status: Married    Spouse name: Not on file   Number of children: Not on file   Years of education: Not on file   Highest education level: Not on file  Occupational History   Not on file  Tobacco Use   Smoking status: Never   Smokeless tobacco: Not on file  Substance and Sexual Activity   Alcohol use: Never   Drug use: Never   Sexual activity: Not on file  Other Topics Concern   Not on file  Social History Narrative   Not on file   Social Drivers of Health   Financial Resource Strain: Not on  file  Food Insecurity: Not on file  Transportation Needs: Not on file  Physical Activity: Not on file  Stress: Not on file  Social Connections: Not on file    MEDICATIONS:  Current Outpatient Medications  Medication Sig Dispense Refill   ALPRAZolam (XANAX XR) 0.5 MG 24 hr tablet Take 0.5 mg by mouth daily as needed for anxiety.     amLODipine  (NORVASC ) 5 MG tablet Take 1 tablet (5 mg total) by mouth daily. 90 tablet 1   atenolol (TENORMIN) 50 MG tablet TAKE ONE TABLET BY MOUTH TWICE DAILY 180 tablet 1   azelastine (ASTELIN) 0.1 % nasal spray Place 2 sprays into both nostrils daily. Use in each nostril as directed     Blood Glucose Monitoring Suppl DEVI 1 each by Does not apply route in the morning, at noon, and at bedtime. May substitute to any manufacturer covered by patient's insurance. 1 each 0   Calcium Carbonate-Vitamin D  (CALCIUM-VITAMIN D  PO) Take by mouth. Take 1 cap a day     cyclobenzaprine (FLEXERIL) 10 MG tablet Take 10 mg by mouth 3 (three) times daily as needed for muscle spasms.     dutasteride (AVODART) 0.5 MG capsule Take 0.5 mg by mouth daily.     famotidine (PEPCID) 20 MG tablet Take 20 mg by mouth 2 (two) times daily.     metFORMIN (GLUCOPHAGE-XR) 500 MG 24 hr tablet Take 1 tablet (500 mg total) by mouth 2 (two) times daily with a meal. 90 tablet 3   montelukast (SINGULAIR) 10 MG tablet Take 10 mg by mouth as needed.     Multiple Vitamins-Minerals (CENTRUM ADULTS PO) Take 1 tablet by mouth.     potassium chloride (KLOR-CON) 20 MEQ packet Take 20 mEq by mouth 2 (two) times daily.     raloxifene (EVISTA) 60 MG tablet TAKE ONE (1) TABLET BY MOUTH EVERY DAY 90 tablet 1   rosuvastatin (CRESTOR) 20 MG tablet TAKE 1 TABLET BY MOUTH EVERY EVENING 90 tablet 1   Simethicone (GAS-X PO) Take by mouth.     empagliflozin (JARDIANCE) 25 MG TABS tablet Take 1 tablet (25 mg total) by mouth daily. 90 tablet 3   losartan (COZAAR) 100 MG tablet TAKE ONE (1) TABLET BY MOUTH EVERY DAY  (Patient not taking: Reported on 06/03/2023) 90 tablet 1   No current facility-administered medications for this visit.    PHYSICAL EXAM: Vitals:   06/03/23 1438  BP: 136/80  Pulse: 62  Resp: 20  SpO2: 98%  Weight: 165 lb 3.2 oz (74.9 kg)  Height: 5\' 5"  (1.651 m)   Body mass index is 27.49 kg/m.  Wt  Readings from Last 3 Encounters:  06/03/23 165 lb 3.2 oz (74.9 kg)  04/15/23 167 lb (75.8 kg)  02/18/23 167 lb 6.4 oz (75.9 kg)    General: Well developed, well nourished female in no apparent distress.  HEENT: AT/[place], no external lesions.  Eyes: Conjunctiva clear and no icterus. Neck: Neck supple  Lungs: Respirations not labored Neurologic: Alert, oriented, normal speech Extremities / Skin: Dry. No sores or rashes noted.  Psychiatric: Does not appear depressed or anxious  Diabetic Foot Exam - Simple   No data filed    LABS Reviewed Lab Results  Component Value Date   HGBA1C 6.1 (A) 04/15/2023   HGBA1C 7.6 (A) 01/15/2023   HGBA1C 7.9 07/20/2022   No results found for: "FRUCTOSAMINE" Lab Results  Component Value Date   CHOL 109 01/15/2023   HDL 45.30 01/15/2023   LDLCALC 33 01/15/2023   TRIG 157.0 (H) 01/15/2023   CHOLHDL 2 01/15/2023   Lab Results  Component Value Date   MICRALBCREAT 0.7 01/15/2023   Lab Results  Component Value Date   CREATININE 1.33 (H) 02/18/2023   Lab Results  Component Value Date   GFR 38.66 (L) 02/18/2023    ASSESSMENT / PLAN  1. Type 2 diabetes mellitus with other specified complication, without long-term current use of insulin (HCC)     Diabetes Mellitus type 2, complicated by CKD. - Diabetic status / severity: Improving.  Lab Results  Component Value Date   HGBA1C 6.1 (A) 04/15/2023    - Hemoglobin A1c goal : <6.5%  Discussed about type 2 diabetes mellitus and potential chronic complications including diabetic retinopathy, neuropathy and nephropathy.  Patient has improvement on diabetes control.  Due to CKD I would  like to decrease the dose of metformin.  Adjusted diabetes regimen as follows.  - Medications: See below.  I) decrease metformin 500 mg 2 tablets 2 times a day to metformin extended release 500 mg 1 tablet daily. II) increase Jardiance from 10 mg to 25 mg daily.  - Home glucose testing: Advised to check blood sugar in the morning fasting and occasionally at bedtime.  Asked to bring glucometer in the follow-up visit.  Patient has glucometer and test supplies at home.  - Discussed/ Gave Hypoglycemia treatment plan.  # Consult : Refer to diabetic educator/dietitian.  # Annual urine for microalbuminuria/ creatinine ratio, no microalbuminuria currently, continue Jardiance.    Lab Results  Component Value Date   MICRALBCREAT 0.7 01/15/2023    # Foot check nightly.  # Annual dilated diabetic eye exams.   - Diet: Make healthy diabetic food choices - Life style / activity / exercise: Discussed.  2. Blood pressure  -  BP Readings from Last 1 Encounters:  06/03/23 136/80    - Control is in target.  - No change in current plans.  3. Lipid status / Hyperlipidemia - Last  Lab Results  Component Value Date   LDLCALC 33 01/15/2023   - Continue rosuvastatin 20 mg daily.  Managed by PCP.  Diagnoses and all orders for this visit:  Type 2 diabetes mellitus with other specified complication, without long-term current use of insulin (HCC) -     metFORMIN (GLUCOPHAGE-XR) 500 MG 24 hr tablet; Take 1 tablet (500 mg total) by mouth 2 (two) times daily with a meal. -     empagliflozin (JARDIANCE) 25 MG TABS tablet; Take 1 tablet (25 mg total) by mouth daily. -     Amb Referral to Nutrition and Diabetic Education  DISPOSITION Follow up in clinic in 3 months suggested.   All questions answered and patient verbalized understanding of the plan.  Cassie Jenasis Straley, MD [facility] Endocrin[facility] [address] [place], [address] [place], [place] [address] Phone #  [phone]  At least part of this note was generated using voice recognition software. Inadvertent word errors may have occurred, which were not recognized during the proofreading process.

## 2023-07-22 ENCOUNTER — Encounter: Payer: Self-pay | Admitting: Dietician

## 2023-07-22 ENCOUNTER — Encounter: Attending: Endocrinology | Admitting: Dietician

## 2023-07-22 VITALS — Ht 65.0 in | Wt 167.0 lb

## 2023-07-22 DIAGNOSIS — E1169 Type 2 diabetes mellitus with other specified complication: Secondary | ICD-10-CM | POA: Diagnosis present

## 2023-07-22 NOTE — Patient Instructions (Signed)
 Aim to be more active - Walk about 1 mile 5 days per week Look at nutrition quality  Aim for 2-3 Carb Choices per meal (30-45 grams) +/- 1 either way  Aim for 0-1 Carbs per snack if hungry  Include protein in moderation with your meals and snacks Consider reading food labels for Total Carbohydrate of foods Consider  increasing your activity level by walking for 30 minutes daily as tolerated Consider checking BG at alternate times per day  Consider taking medication as directed by MD

## 2023-07-22 NOTE — Progress Notes (Signed)
 Diabetes Self-Management Education  Visit Type: First/Initial  Appt. Start Time: 1520 Appt. End Time: 1405  07/22/2023  Ms. Cassie Wilson, identified by name and date of birth, is a 78 y.o. female with a diagnosis of Diabetes: Type 2.   ASSESSMENT Patient is here today alone.   She has brought her blood glucose meter and would like to learn to use this. Blood glucose in the office was 134.  Patient was able to demonstrate use of the blood glucose meter.  Referral:  Type 2 Diabetes (Dr. Aretha Kubas)  History includes:  Type 2 diabetes (07/2022), HLD, HTN, osteoporosis, anxiety Medications include:  Jardiance , Metformin  XR, calcium with vitamin D , KCL, MVI Did not tolerate - Ozempic and Mounjaro (increased vomiting and stopped) Labs noted to include:  A1C 6.1% 04/15/2023 decreased from 7.6% 01/15/2023, BUN 18, Creatinine 1.33, Potassium 3.8,eGFR 38 02/18/2023, Vitamin B-12 was 349 and Vitamin D  was 44 on 07/20/2022 Lipid Panel     Component Value Date/Time   CHOL 109 01/15/2023 1548   TRIG 157.0 (H) 01/15/2023 1548   HDL 45.30 01/15/2023 1548   CHOLHDL 2 01/15/2023 1548   VLDL 31.4 01/15/2023 1548   LDLCALC 33 01/15/2023 1548    65 167 lbs 07/22/2023 177 lbs 11/2022 - lost with Ozempic but did not tolerate  Patient lives her husband and oldest daughter.  Patient does most of the shopping and cooking.  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 Allergic to seafood (shellfish-crab) Has a walking pad at home and uses occasionally or walks outside. Intermittent fasting - starts eating between 10 am and noon and stops at about 8 pm Eats 2-3 meals per day  Height 5' 5 (1.651 m), weight 167 lb (75.8 kg). Body mass index is 27.79 kg/m.   Diabetes Self-Management Education - 07/22/23 1423       Visit Information   Visit Type First/Initial      Initial Visit   Diabetes Type Type 2    Date Diagnosed 2024   summer   Are you currently following a meal plan? No    Are you  taking your medications as prescribed? Yes      Health Coping   How would you rate your overall health? Good      Psychosocial Assessment   Patient Belief/Attitude about Diabetes Motivated to manage diabetes    What is the hardest part about your diabetes right now, causing you the most concern, or is the most worrisome to you about your diabetes?   Making healty food and beverage choices;Checking blood sugar    Self-care barriers None    Self-management support Doctor's office    Other persons present Patient    Patient Concerns Nutrition/Meal planning;Glycemic Control;Medication;Weight Control;Healthy Lifestyle;Monitoring    Special Needs None    Learning Readiness Ready    How often do you need to have someone help you when you read instructions, pamphlets, or other written materials from your doctor or pharmacy? 1 - Never    What is the last grade level you completed in school? graduate school      Pre-Education Assessment   Patient understands the diabetes disease and treatment process. Needs Instruction    Patient understands incorporating nutritional management into lifestyle. Needs Instruction    Patient undertands incorporating physical activity into lifestyle. Needs Instruction    Patient understands using medications safely. Needs Instruction    Patient understands monitoring blood glucose, interpreting and using results Needs Instruction    Patient understands  prevention, detection, and treatment of acute complications. Needs Instruction    Patient understands prevention, detection, and treatment of chronic complications. Needs Instruction    Patient understands how to develop strategies to address psychosocial issues. Needs Instruction    Patient understands how to develop strategies to promote health/change behavior. Needs Instruction      Complications   Last HgB A1C per patient/outside source 6.1 %   04/15/2023 decreased from 7.6% 01/15/2023   How often do you check your  blood sugar? 0 times/day (not testing)    Have you had a dilated eye exam in the past 12 months? Yes    Have you had a dental exam in the past 12 months? Yes    Are you checking your feet? Yes    How many days per week are you checking your feet? 1      Dietary Intake   Breakfast none    Lunch cottage cheese with cinnamon OR eggs with occasional toast OR leftover salad with cheese    Snack (afternoon) n    Dinner [place] lettuce wrap with pork and vegetables, small amount of noodles    Snack (evening) none    Beverage(s) water, occasional herbal tea (unsweetened), occasional milk, rare OJ      Activity / Exercise   Activity / Exercise Type Light (walking / raking leaves)    How many days per week do you exercise? 2    How many minutes per day do you exercise? 45    Total minutes per week of exercise 90      Patient Education   Previous Diabetes Education No    Disease Pathophysiology Definition of diabetes, type 1 and 2, and the diagnosis of diabetes    Healthy Eating Role of diet in the treatment of diabetes and the relationship between the three main macronutrients and blood glucose level;Plate Method;Meal options for control of blood glucose level and chronic complications.    Being Active Role of exercise on diabetes management, blood pressure control and cardiac health.;Helped patient identify appropriate exercises in relation to his/her diabetes, diabetes complications and other health issue.    Medications Reviewed patients medication for diabetes, action, purpose, timing of dose and side effects.    Monitoring Purpose and frequency of SMBG.;Identified appropriate SMBG and/or A1C goals.;Taught/evaluated SMBG meter.    Acute complications Taught prevention, symptoms, and  treatment of hypoglycemia - the 15 rule.;Discussed and identified patients' prevention, symptoms, and treatment of hyperglycemia.    Chronic complications Relationship between chronic complications and blood glucose  control    Diabetes Stress and Support Identified and addressed patients feelings and concerns about diabetes;Worked with patient to identify barriers to care and solutions;Role of stress on diabetes      Individualized Goals (developed by patient)   Nutrition General guidelines for healthy choices and portions discussed    Physical Activity Exercise 3-5 times per week;30 minutes per day    Medications take my medication as prescribed    Monitoring  Test my blood glucose as discussed    Problem Solving Eating Pattern    Reducing Risk examine blood glucose patterns;do foot checks daily;treat hypoglycemia with 15 grams of carbs if blood glucose less than 70mg /dL    Health Coping Ask for help with psychological, social, or emotional issues      Post-Education Assessment   Patient understands the diabetes disease and treatment process. Demonstrates understanding / competency    Patient understands incorporating nutritional management into lifestyle. Comprehends key points  Patient undertands incorporating physical activity into lifestyle. Demonstrates understanding / competency    Patient understands using medications safely. Demonstrates understanding / competency    Patient understands monitoring blood glucose, interpreting and using results Demonstrates understanding / competency    Patient understands prevention, detection, and treatment of acute complications. Demonstrates understanding / competency    Patient understands prevention, detection, and treatment of chronic complications. Demonstrates understanding / competency    Patient understands how to develop strategies to address psychosocial issues. Demonstrates understanding / competency    Patient understands how to develop strategies to promote health/change behavior. Comprehends key points      Outcomes   Expected Outcomes Demonstrated interest in learning. Expect positive outcomes    Future DMSE PRN    Program Status Completed           Individualized Plan for Diabetes Self-Management Training:   Learning Objective:  Patient will have a greater understanding of diabetes self-management. Patient education plan is to attend individual and/or group sessions per assessed needs and concerns.   Plan:   Patient Instructions  Aim to be more active - Walk about 1 mile 5 days per week Look at nutrition quality  Aim for 2-3 Carb Choices per meal (30-45 grams) +/- 1 either way  Aim for 0-1 Carbs per snack if hungry  Include protein in moderation with your meals and snacks Consider reading food labels for Total Carbohydrate of foods Consider  increasing your activity level by walking for 30 minutes daily as tolerated Consider checking BG at alternate times per day  Consider taking medication as directed by MD     Expected Outcomes:  Demonstrated interest in learning. Expect positive outcomes  Education material provided: ADA - How to Thrive: A Guide for Your Journey with Diabetes, Food label handouts, Meal plan card, Snack sheet, and Diabetes Resources, ACLM [employer] of Lifestyle Medicine) spectrum of eating, ACLM Jumpstart - includes recipes,   If problems or questions, patient to contact team via:  Phone  Future DSME appointment: PRN

## 2023-08-11 ENCOUNTER — Other Ambulatory Visit: Payer: Self-pay | Admitting: Internal Medicine

## 2023-08-11 DIAGNOSIS — I10 Essential (primary) hypertension: Secondary | ICD-10-CM

## 2023-08-12 ENCOUNTER — Other Ambulatory Visit

## 2023-08-20 ENCOUNTER — Encounter: Payer: Self-pay | Admitting: Internal Medicine

## 2023-08-20 ENCOUNTER — Ambulatory Visit (INDEPENDENT_AMBULATORY_CARE_PROVIDER_SITE_OTHER): Admitting: Internal Medicine

## 2023-08-20 VITALS — BP 120/70 | HR 55 | Temp 98.0°F | Wt 176.4 lb

## 2023-08-20 DIAGNOSIS — I10 Essential (primary) hypertension: Secondary | ICD-10-CM | POA: Diagnosis not present

## 2023-08-20 DIAGNOSIS — M810 Age-related osteoporosis without current pathological fracture: Secondary | ICD-10-CM

## 2023-08-20 DIAGNOSIS — E785 Hyperlipidemia, unspecified: Secondary | ICD-10-CM

## 2023-08-20 DIAGNOSIS — N1831 Chronic kidney disease, stage 3a: Secondary | ICD-10-CM | POA: Diagnosis not present

## 2023-08-20 DIAGNOSIS — E1169 Type 2 diabetes mellitus with other specified complication: Secondary | ICD-10-CM

## 2023-08-20 DIAGNOSIS — Z7984 Long term (current) use of oral hypoglycemic drugs: Secondary | ICD-10-CM

## 2023-08-20 LAB — BASIC METABOLIC PANEL WITH GFR
BUN: 33 mg/dL — ABNORMAL HIGH (ref 6–23)
CO2: 26 meq/L (ref 19–32)
Calcium: 10.4 mg/dL (ref 8.4–10.5)
Chloride: 106 meq/L (ref 96–112)
Creatinine, Ser: 1.22 mg/dL — ABNORMAL HIGH (ref 0.40–1.20)
GFR: 42.73 mL/min — ABNORMAL LOW (ref >60.00)
Glucose, Bld: 148 mg/dL — ABNORMAL HIGH (ref 70–99)
Potassium: 4.7 meq/L (ref 3.5–5.1)
Sodium: 138 meq/L (ref 135–145)

## 2023-08-20 LAB — POCT GLYCOSYLATED HEMOGLOBIN (HGB A1C): Hemoglobin A1C: 6.7 % — AB (ref 4.0–5.6)

## 2023-08-20 NOTE — Assessment & Plan Note (Signed)
 Well-controlled with an A1c of 6.7 continue Jardiance  and metformin .  Now followed by endocrinology.

## 2023-08-20 NOTE — Assessment & Plan Note (Signed)
 Blood pressure is well-controlled on current.

## 2023-08-20 NOTE — Assessment & Plan Note (Signed)
 Followed by nephrology.  Baseline creatinine around 1.3-1.4.

## 2023-08-20 NOTE — Progress Notes (Signed)
 Established Patient Office Visit     CC/Reason for Visit: Follow-up chronic conditions  HPI: Philamena Kramar is a 78 y.o. female who is coming in today for the above mentioned reasons. Past Medical History is significant for: Hypertension, hyperlipidemia, type 2 diabetes, osteoporosis.  She has now established care with local endocrinology and nephrology.  Nephrology wants her to have a BMP and she is wondering whether she can have it in office today.  Is feeling well and has no acute concerns or complaints.   Past Medical/Surgical History: Past Medical History:  Diagnosis Date   Back pain    Diabetes mellitus without complication (HCC)    DM (diabetes mellitus), type 2 (HCC)    Gas    Hyperlipidemia associated with type 2 diabetes mellitus (HCC)    Hypertension    Osteoporosis    Panic attack    Post-menopause     Past Surgical History:  Procedure Laterality Date   neck nerve block     TRIGGER FINGER RELEASE      Social History:  reports that she has never smoked. She does not have any smokeless tobacco history on file. She reports that she does not drink alcohol and does not use drugs.  Allergies: Allergies  Allergen Reactions   Other     Shell fish - gi upset   Sulfa Antibiotics    Tape    Prednisone Palpitations    Family History:  Family History  Problem Relation Age of Onset   Heart failure Mother    CAD Father      Current Outpatient Medications:    amLODipine  (NORVASC ) 5 MG tablet, TAKE ONE TABLET BY MOUTH EVERY DAY, Disp: 90 tablet, Rfl: 1   atenolol (TENORMIN) 50 MG tablet, TAKE ONE TABLET BY MOUTH TWICE DAILY, Disp: 180 tablet, Rfl: 1   azelastine (ASTELIN) 0.1 % nasal spray, Place 2 sprays into both nostrils daily. Use in each nostril as directed, Disp: , Rfl:    Blood Glucose Monitoring Suppl DEVI, 1 each by Does not apply route in the morning, at noon, and at bedtime. May substitute to any manufacturer covered by patient's insurance.,  Disp: 1 each, Rfl: 0   Calcium Carbonate-Vitamin D  (CALCIUM-VITAMIN D  PO), Take by mouth. Take 1 cap a day, Disp: , Rfl:    cyclobenzaprine (FLEXERIL) 10 MG tablet, Take 10 mg by mouth 3 (three) times daily as needed for muscle spasms., Disp: , Rfl:    dutasteride (AVODART) 0.5 MG capsule, Take 0.5 mg by mouth daily., Disp: , Rfl:    empagliflozin  (JARDIANCE ) 25 MG TABS tablet, Take 1 tablet (25 mg total) by mouth daily., Disp: 90 tablet, Rfl: 3   famotidine (PEPCID) 20 MG tablet, Take 20 mg by mouth 2 (two) times daily., Disp: , Rfl:    metFORMIN  (GLUCOPHAGE -XR) 500 MG 24 hr tablet, Take 1 tablet (500 mg total) by mouth 2 (two) times daily with a meal., Disp: 90 tablet, Rfl: 3   montelukast (SINGULAIR) 10 MG tablet, Take 10 mg by mouth as needed., Disp: , Rfl:    Multiple Vitamins-Minerals (CENTRUM ADULTS PO), Take 1 tablet by mouth., Disp: , Rfl:    potassium chloride (KLOR-CON) 20 MEQ packet, Take 20 mEq by mouth 2 (two) times daily., Disp: , Rfl:    raloxifene (EVISTA) 60 MG tablet, TAKE ONE (1) TABLET BY MOUTH EVERY DAY, Disp: 90 tablet, Rfl: 1   rosuvastatin (CRESTOR) 20 MG tablet, TAKE 1 TABLET BY MOUTH EVERY  EVENING, Disp: 90 tablet, Rfl: 1   Simethicone (GAS-X PO), Take by mouth., Disp: , Rfl:    ALPRAZolam (XANAX XR) 0.5 MG 24 hr tablet, Take 0.5 mg by mouth daily as needed for anxiety. (Patient not taking: Reported on 07/22/2023), Disp: , Rfl:    losartan (COZAAR) 100 MG tablet, TAKE ONE (1) TABLET BY MOUTH EVERY DAY (Patient not taking: Reported on 07/22/2023), Disp: 90 tablet, Rfl: 1  Review of Systems:  Negative unless indicated in HPI.   Physical Exam: Vitals:   08/20/23 0913  BP: 120/70  Pulse: (!) 55  Temp: 98 F (36.7 C)  TempSrc: Oral  SpO2: 96%  Weight: 176 lb 6.4 oz (80 kg)    Body mass index is 29.35 kg/m.   Physical Exam Vitals reviewed.  Constitutional:      Appearance: Normal appearance.  HENT:     Head: Normocephalic and atraumatic.  Eyes:      Conjunctiva/sclera: Conjunctivae normal.  Cardiovascular:     Rate and Rhythm: Normal rate and regular rhythm.  Pulmonary:     Effort: Pulmonary effort is normal.     Breath sounds: Normal breath sounds.  Skin:    General: Skin is warm and dry.  Neurological:     General: No focal deficit present.     Mental Status: She is alert and oriented to person, place, and time.  Psychiatric:        Mood and Affect: Mood normal.        Behavior: Behavior normal.        Thought Content: Thought content normal.        Judgment: Judgment normal.      Impression and Plan:  Type 2 diabetes mellitus with other specified complication, without long-term current use of insulin (HCC) Assessment & Plan: Well-controlled with an A1c of 6.7 continue Jardiance  and metformin .  Now followed by endocrinology.  Orders: -     POCT glycosylated hemoglobin (Hb A1C) -     Microalbumin / creatinine urine ratio; Future  Primary hypertension Assessment & Plan: Blood pressure is well-controlled on current.   Hyperlipidemia associated with type 2 diabetes mellitus (HCC) Assessment & Plan: Continue rosuvastatin, LDL at goal at 33.   Age-related osteoporosis without current pathological fracture Assessment & Plan: On Evista.   Stage 3a chronic kidney disease (HCC) Assessment & Plan: Followed by nephrology.  Baseline creatinine around 1.3-1.4.  Orders: -     Basic metabolic panel with GFR; Future     Time spent:32 minutes reviewing chart, interviewing and examining patient and formulating plan of care.     Tully Theophilus Andrews, MD [facility]

## 2023-08-20 NOTE — Assessment & Plan Note (Signed)
 Continue rosuvastatin, LDL at goal at 33.

## 2023-08-20 NOTE — Assessment & Plan Note (Signed)
On Evista 

## 2023-08-21 ENCOUNTER — Other Ambulatory Visit: Payer: Self-pay | Admitting: Internal Medicine

## 2023-08-21 ENCOUNTER — Ambulatory Visit: Payer: Self-pay | Admitting: Internal Medicine

## 2023-08-29 ENCOUNTER — Other Ambulatory Visit: Payer: Self-pay | Admitting: Internal Medicine

## 2023-09-10 ENCOUNTER — Ambulatory Visit (INDEPENDENT_AMBULATORY_CARE_PROVIDER_SITE_OTHER): Admitting: Endocrinology

## 2023-09-10 ENCOUNTER — Encounter: Payer: Self-pay | Admitting: Endocrinology

## 2023-09-10 VITALS — BP 162/70 | HR 60 | Ht 65.0 in | Wt 177.6 lb

## 2023-09-10 DIAGNOSIS — E1169 Type 2 diabetes mellitus with other specified complication: Secondary | ICD-10-CM | POA: Diagnosis not present

## 2023-09-10 DIAGNOSIS — Z7984 Long term (current) use of oral hypoglycemic drugs: Secondary | ICD-10-CM | POA: Diagnosis not present

## 2023-09-10 NOTE — Progress Notes (Signed)
 Outpatient Endocrinology Note [place] Cecilia Vancleve, MD   Patient's Name: Cassie Wilson    DOB: 1945/03/06    MRN: [id number]                                                    REASON OF VISIT: Follow-up for type 2 diabetes mellitus  REFERRING PROVIDER: Theophilus Andrews, Tully GRADE, MD  PCP: Theophilus Andrews, Tully GRADE, MD  HISTORY OF PRESENT ILLNESS:   Cassie Wilson is a 78 y.o. old female with past medical history listed below, is here for follow-up for type 2 diabetes mellitus.   Pertinent Diabetes History: Patient was referred to endocrinology for evaluation and management of type 2 diabetes mellitus, initial consult in April 2025.  Patient was diagnosed with type 2 diabetes mellitus in June 2024 at that time hemoglobin A1c was 7.9%.  Patient states she was prediabetic for several years and was taking metformin  prior to diagnosis of type 2 diabetes mellitus.  She had hemoglobin A1c of 7.6% in December 2024 and improved to 6.1% in March 2025.  Patient had taken Ozempic and Mounjaro  in the past with GI intolerance with nausea and vomiting and is stopped.    History of DKA or diabetes related hospitalizations: none  Chronic Diabetes Complications : Retinopathy: no. Last ophthalmology exam was done on 01/2023, following with ophthalmology regularly.  Nephropathy: CKD IIIb, was on losartan, stopped later.  On SGLT2 inhibitor/Jardiance .  Following with nephrology. Peripheral neuropathy: no Coronary artery disease: no Stroke: no  Relevant comorbidities and cardiovascular risk factors: Obesity: no Body mass index is 29.55 kg/m.  Hypertension: Yes  Hyperlipidemia : Yes, on statin   Current / Home Diabetic regimen includes:  Metformin  ER 500 mg daily. Jardiance  25 mg daily.  Prior diabetic medications: Ozempic and Mounjaro  taken in the past stopped due to GI intolerance.  Glycemic data:   Glucometer data download from July 24 to September 10, 2023 reviewed, average blood sugar  119.  She has been checking blood sugar in the morning fasting some with other fasting blood sugar 123, 125, 107, 135, 116, 107, 103.  Hypoglycemia: Patient has no hypoglycemic episodes. Patient has hypoglycemia awareness.  Factors modifying glucose control: 1.  Diabetic diet assessment: 3 meals a day.  2.  Staying active or exercising:   3.  Medication compliance: compliant all of the time.  # Osteoporosis, on Evista, managed by PCP.  Interval history  Patient hemoglobin A1c 6.7%.  Glucometer data as reviewed above, mostly acceptable blood sugar.  Diabetes regimen as reviewed and noted above.  She has no numbness and tingling of feet.  No vision problem.  No other complaints today.  REVIEW OF SYSTEMS As per history of present illness.   PAST MEDICAL HISTORY: Past Medical History:  Diagnosis Date   Back pain    Diabetes mellitus without complication (HCC)    DM (diabetes mellitus), type 2 (HCC)    Gas    Hyperlipidemia associated with type 2 diabetes mellitus (HCC)    Hypertension    Osteoporosis    Panic attack    Post-menopause     PAST SURGICAL HISTORY: Past Surgical History:  Procedure Laterality Date   neck nerve block     TRIGGER FINGER RELEASE      ALLERGIES: Allergies  Allergen Reactions   Other  Shell fish - gi upset   Sulfa Antibiotics    Tape    Prednisone Palpitations    FAMILY HISTORY:  Family History  Problem Relation Age of Onset   Heart failure Mother    CAD Father     SOCIAL HISTORY: Social History   Socioeconomic History   Marital status: Married    Spouse name: Not on file   Number of children: Not on file   Years of education: Not on file   Highest education level: Not on file  Occupational History   Not on file  Tobacco Use   Smoking status: Never   Smokeless tobacco: Not on file  Substance and Sexual Activity   Alcohol use: Never   Drug use: Never   Sexual activity: Not on file  Other Topics Concern   Not on file   Social History Narrative   Not on file   Social Drivers of Health   Financial Resource Strain: Not on file  Food Insecurity: Not on file  Transportation Needs: Not on file  Physical Activity: Not on file  Stress: Not on file  Social Connections: Not on file    MEDICATIONS:  Current Outpatient Medications  Medication Sig Dispense Refill   ALPRAZolam (XANAX XR) 0.5 MG 24 hr tablet Take 0.5 mg by mouth daily as needed for anxiety.     amLODipine  (NORVASC ) 5 MG tablet TAKE ONE TABLET BY MOUTH EVERY DAY 90 tablet 1   atenolol (TENORMIN) 50 MG tablet TAKE ONE TABLET BY MOUTH TWICE DAILY 180 tablet 1   azelastine (ASTELIN) 0.1 % nasal spray Place 2 sprays into both nostrils daily. Use in each nostril as directed     Blood Glucose Monitoring Suppl DEVI 1 each by Does not apply route in the morning, at noon, and at bedtime. May substitute to any manufacturer covered by patient's insurance. 1 each 0   Calcium Carbonate-Vitamin D  (CALCIUM-VITAMIN D  PO) Take by mouth. Take 1 cap a day     cyclobenzaprine (FLEXERIL) 10 MG tablet Take 10 mg by mouth 3 (three) times daily as needed for muscle spasms.     dutasteride (AVODART) 0.5 MG capsule Take 0.5 mg by mouth daily.     empagliflozin  (JARDIANCE ) 25 MG TABS tablet Take 1 tablet (25 mg total) by mouth daily. 90 tablet 3   famotidine (PEPCID) 20 MG tablet Take 20 mg by mouth 2 (two) times daily.     metFORMIN  (GLUCOPHAGE -XR) 500 MG 24 hr tablet Take 1 tablet (500 mg total) by mouth 2 (two) times daily with a meal. 90 tablet 3   montelukast (SINGULAIR) 10 MG tablet Take 10 mg by mouth as needed.     Multiple Vitamins-Minerals (CENTRUM ADULTS PO) Take 1 tablet by mouth.     potassium chloride (KLOR-CON) 20 MEQ packet Take 20 mEq by mouth 2 (two) times daily.     raloxifene (EVISTA) 60 MG tablet TAKE ONE (1) TABLET BY MOUTH EVERY DAY 90 tablet 1   rosuvastatin (CRESTOR) 20 MG tablet TAKE 1 TABLET BY MOUTH EVERY EVENING 90 tablet 1   Simethicone (GAS-X  PO) Take by mouth.     No current facility-administered medications for this visit.    PHYSICAL EXAM: Vitals:   09/10/23 1451  BP: (!) 162/70  Pulse: 60  SpO2: 98%  Weight: 177 lb 9.6 oz (80.6 kg)  Height: 5' 5 (1.651 m)    Body mass index is 29.55 kg/m.  Wt Readings from Last 3 Encounters:  09/10/23 177 lb 9.6 oz (80.6 kg)  08/20/23 176 lb 6.4 oz (80 kg)  07/22/23 167 lb (75.8 kg)   Repeat blood pressure 140/78.  General: Well developed, well nourished female in no apparent distress.  HEENT: AT/, no external lesions.  Eyes: Conjunctiva clear and no icterus. Neck: Neck supple  Lungs: Respirations not labored Neurologic: Alert, oriented, normal speech Extremities / Skin: Dry.  Psychiatric: Does not appear depressed or anxious  Diabetic Foot Exam - Simple   No data filed    LABS Reviewed Lab Results  Component Value Date   HGBA1C 6.7 (A) 08/20/2023   HGBA1C 6.1 (A) 04/15/2023   HGBA1C 7.6 (A) 01/15/2023   No results found for: FRUCTOSAMINE Lab Results  Component Value Date   CHOL 109 01/15/2023   HDL 45.30 01/15/2023   LDLCALC 33 01/15/2023   TRIG 157.0 (H) 01/15/2023   CHOLHDL 2 01/15/2023   No results found for: [facility]  Lab Results  Component Value Date   CREATININE 1.22 (H) 08/20/2023   Lab Results  Component Value Date   GFR 42.73 (L) 08/20/2023    ASSESSMENT / PLAN  1. Type 2 diabetes mellitus with other specified complication, without long-term current use of insulin (HCC)      Diabetes Mellitus type 2, complicated by CKD. - Diabetic status / severity: Improving.  Fair control.  Lab Results  Component Value Date   HGBA1C 6.7 (A) 08/20/2023    - Hemoglobin A1c goal : <6.5%  - Medications: See below.  No change.  I) continue metformin  extended release 500 mg 1 tablet daily. II) continue Jardiance  25 mg daily.  - Home glucose testing: Advised to check blood sugar in the morning fasting and occasionally at bedtime.   Asked to bring glucometer in the follow-up visit.  Patient has glucometer and test supplies at home.  - Discussed/ Gave Hypoglycemia treatment plan.  # Consult : Refer to diabetic educator/dietitian.  # Annual urine for microalbuminuria/ creatinine ratio, no microalbuminuria currently, continue Jardiance .  Patient has CKD, following with nephrology and would like to check urine microalbumin creatinine ratio with nephrology.  No results found for: MICRALBCREAT  # Foot check nightly.  # Annual dilated diabetic eye exams.   - Diet: Make healthy diabetic food choices - Life style / activity / exercise: Discussed.  2. Blood pressure  -  BP Readings from Last 1 Encounters:  09/10/23 (!) 162/70    - Control is in target. Repeat blood pressure 140/78. - No change in current plans.  3. Lipid status / Hyperlipidemia - Last  Lab Results  Component Value Date   LDLCALC 33 01/15/2023   - Continue rosuvastatin 20 mg daily.  Managed by PCP.  Diagnoses and all orders for this visit:  Type 2 diabetes mellitus with other specified complication, without long-term current use of insulin (HCC)    DISPOSITION Follow up in clinic in 4 months suggested.   All questions answered and patient verbalized understanding of the plan.  Iraq Keyvon Herter, MD [facility] Endocrinology [facility] [address] [place], [address] [place], [place] [address] Phone # [phone]  At least part of this note was generated using voice recognition software. Inadvertent word errors may have occurred, which were not recognized during the proofreading process.

## 2023-10-09 ENCOUNTER — Other Ambulatory Visit: Payer: Self-pay | Admitting: Internal Medicine

## 2023-11-06 ENCOUNTER — Other Ambulatory Visit: Payer: Self-pay | Admitting: Internal Medicine

## 2024-01-12 ENCOUNTER — Ambulatory Visit: Admitting: Endocrinology

## 2024-01-12 ENCOUNTER — Ambulatory Visit: Payer: Self-pay | Admitting: Endocrinology

## 2024-01-12 ENCOUNTER — Encounter: Payer: Self-pay | Admitting: Endocrinology

## 2024-01-12 VITALS — BP 120/66 | HR 58 | Resp 18 | Ht 65.0 in | Wt 185.0 lb

## 2024-01-12 DIAGNOSIS — Z7984 Long term (current) use of oral hypoglycemic drugs: Secondary | ICD-10-CM

## 2024-01-12 DIAGNOSIS — E1169 Type 2 diabetes mellitus with other specified complication: Secondary | ICD-10-CM

## 2024-01-12 LAB — POCT GLYCOSYLATED HEMOGLOBIN (HGB A1C): Hemoglobin A1C: 7.5 % — AB (ref 4.0–5.6)

## 2024-01-12 NOTE — Progress Notes (Signed)
 Outpatient Endocrinology Note Cassie Meacham, MD   Patient's Name: Cassie Wilson    DOB: [birth year]    MRN: [id number]                                                    REASON OF VISIT: Follow-up for type 2 diabetes mellitus  REFERRING PROVIDER: Theophilus Andrews, Tully GRADE, MD  PCP: Theophilus Andrews, Tully GRADE, MD  HISTORY OF PRESENT ILLNESS:   Cassie Wilson is a 78 y.o. old female with past medical history listed below, is here for follow-up for type 2 diabetes mellitus.   Pertinent Diabetes History: Patient was referred to endocrinology for evaluation and management of type 2 diabetes mellitus, initial consult in April 2025.  Patient was diagnosed with type 2 diabetes mellitus in June 2024 at that time hemoglobin A1c was 7.9%.  Patient states she was prediabetic for several years and was taking metformin  prior to diagnosis of type 2 diabetes mellitus.  She had hemoglobin A1c of 7.6% in December 2024 and improved to 6.1% in March 2025.  Patient had taken Ozempic and Mounjaro  in the past with GI intolerance with nausea and vomiting and were stopped.    History of DKA or diabetes related hospitalizations: none  Chronic Diabetes Complications : Retinopathy: no. Last ophthalmology exam was done on 01/2023, following with ophthalmology regularly.  Nephropathy: CKD IIIb, was on losartan, stopped later.  On SGLT2 inhibitor/Jardiance .  Following with nephrology. Peripheral neuropathy: no Coronary artery disease: no Stroke: no  Relevant comorbidities and cardiovascular risk factors: Obesity: no Body mass index is 30.79 kg/m.  Hypertension: Yes  Hyperlipidemia : Yes, on statin   Current / Home Diabetic regimen includes:  Metformin  ER 500 mg daily. Jardiance  25 mg daily.  Prior diabetic medications: Ozempic and Mounjaro  taken in the past stopped due to GI intolerance.  Glycemic data:   Glucometer data download from November 19 to January 12, 2024 reviewed, average blood  sugar 133.  She has been checking in the morning fasting blood sugar around 10 AM to noon.  Normal blood sugar 141, 180, 107, 137, 127, 148, 115.    Hypoglycemia: Patient has no hypoglycemic episodes. Patient has hypoglycemia awareness.  Factors modifying glucose control: 1.  Diabetic diet assessment: 3 meals a day.  2.  Staying active or exercising:   3.  Medication compliance: compliant all of the time.  # Osteoporosis, on Evista, managed by PCP.  Interval history  Hemoglobin A1c worsened to 7.5% today.  Diabetes has been as reviewed and noted above.  Mostly acceptable blood sugar on glucometer with occasional mild hyperglycemia.  She denies numbness and tingling to feet.  No vision problem.  No other complaints today.  She has been following with nephrology and reports has appointment in around 2 months.  REVIEW OF SYSTEMS As per history of present illness.   PAST MEDICAL HISTORY: Past Medical History:  Diagnosis Date   Back pain    Diabetes mellitus without complication (HCC)    DM (diabetes mellitus), type 2 (HCC)    Gas    Hyperlipidemia associated with type 2 diabetes mellitus (HCC)    Hypertension    Osteoporosis    Panic attack    Post-menopause     PAST SURGICAL HISTORY: Past Surgical History:  Procedure Laterality Date   neck nerve block  TRIGGER FINGER RELEASE      ALLERGIES: Allergies  Allergen Reactions   Other     Shell fish - gi upset   Sulfa Antibiotics    Tape    Prednisone Palpitations    FAMILY HISTORY:  Family History  Problem Relation Age of Onset   Heart failure Mother    CAD Father     SOCIAL HISTORY: Social History   Socioeconomic History   Marital status: Married    Spouse name: Not on file   Number of children: Not on file   Years of education: Not on file   Highest education level: Not on file  Occupational History   Not on file  Tobacco Use   Smoking status: Never   Smokeless tobacco: Not on file  Substance and  Sexual Activity   Alcohol use: Never   Drug use: Never   Sexual activity: Not on file  Other Topics Concern   Not on file  Social History Narrative   Not on file   Social Drivers of Health   Financial Resource Strain: Not on file  Food Insecurity: Not on file  Transportation Needs: Not on file  Physical Activity: Not on file  Stress: Not on file  Social Connections: Not on file    MEDICATIONS:  Current Outpatient Medications  Medication Sig Dispense Refill   ALPRAZolam (XANAX XR) 0.5 MG 24 hr tablet Take 0.5 mg by mouth daily as needed for anxiety.     amLODipine  (NORVASC ) 5 MG tablet TAKE ONE TABLET BY MOUTH EVERY DAY 90 tablet 1   atenolol (TENORMIN) 50 MG tablet TAKE ONE TABLET BY MOUTH TWICE DAILY 180 tablet 1   azelastine (ASTELIN) 0.1 % nasal spray Place 2 sprays into both nostrils daily. Use in each nostril as directed     Blood Glucose Monitoring Suppl DEVI 1 each by Does not apply route in the morning, at noon, and at bedtime. May substitute to any manufacturer covered by patient's insurance. 1 each 0   Calcium Carbonate-Vitamin D  (CALCIUM-VITAMIN D  PO) Take by mouth. Take 1 cap a day     cyclobenzaprine (FLEXERIL) 10 MG tablet Take 10 mg by mouth 3 (three) times daily as needed for muscle spasms.     dutasteride (AVODART) 0.5 MG capsule Take 0.5 mg by mouth daily.     empagliflozin  (JARDIANCE ) 25 MG TABS tablet Take 1 tablet (25 mg total) by mouth daily. 90 tablet 3   famotidine (PEPCID) 20 MG tablet Take 20 mg by mouth 2 (two) times daily.     metFORMIN  (GLUCOPHAGE -XR) 500 MG 24 hr tablet Take 1 tablet (500 mg total) by mouth 2 (two) times daily with a meal. 90 tablet 3   montelukast (SINGULAIR) 10 MG tablet Take 10 mg by mouth as needed.     Multiple Vitamins-Minerals (CENTRUM ADULTS PO) Take 1 tablet by mouth.     potassium chloride (KLOR-CON) 20 MEQ packet Take 20 mEq by mouth 2 (two) times daily.     potassium chloride SA (KLOR-CON M) 20 MEQ tablet TAKE ONE TABLET  BY MOUTH TWICE DAILY 180 tablet 0   raloxifene (EVISTA) 60 MG tablet TAKE ONE (1) TABLET BY MOUTH EVERY DAY 90 tablet 1   rosuvastatin (CRESTOR) 20 MG tablet TAKE 1 TABLET BY MOUTH EVERY EVENING 90 tablet 1   Simethicone (GAS-X PO) Take by mouth.     No current facility-administered medications for this visit.    PHYSICAL EXAM: Vitals:   01/12/24 1402 01/12/24  1403  BP: (!) 160/62 120/66  Pulse: (!) 58   Resp: 18   SpO2: 98%   Weight: 185 lb (83.9 kg)   Height: 5' 5 (1.651 m)     Body mass index is 30.79 kg/m.  Wt Readings from Last 3 Encounters:  01/12/24 185 lb (83.9 kg)  09/10/23 177 lb 9.6 oz (80.6 kg)  08/20/23 176 lb 6.4 oz (80 kg)   Repeat blood pressure 140/78.  General: Well developed, well nourished female in no apparent distress.  HEENT: AT/[place], no external lesions.  Eyes: Conjunctiva clear and no icterus. Neck: Neck supple  Lungs: Respirations not labored Neurologic: Alert, oriented, normal speech Extremities / Skin: Dry.  Psychiatric: Does not appear depressed or anxious  Diabetic Foot Exam - Simple   Simple Foot Form Diabetic Foot exam was performed with the following findings: Yes 01/12/2024  2:23 PM  Visual Inspection No deformities, no ulcerations, no other skin breakdown bilaterally: Yes Sensation Testing Intact to touch and monofilament testing bilaterally: Yes Pulse Check Posterior Tibialis and Dorsalis pulse intact bilaterally: Yes Comments    LABS Reviewed Lab Results  Component Value Date   HGBA1C 7.5 (A) 01/12/2024   HGBA1C 6.7 (A) 08/20/2023   HGBA1C 6.1 (A) 04/15/2023   No results found for: FRUCTOSAMINE Lab Results  Component Value Date   CHOL 109 01/15/2023   HDL 45.30 01/15/2023   LDLCALC 33 01/15/2023   TRIG 157.0 (H) 01/15/2023   CHOLHDL 2 01/15/2023   No results found for: [facility]  Lab Results  Component Value Date   CREATININE 1.22 (H) 08/20/2023   Lab Results  Component Value Date   GFR 42.73 (L)  08/20/2023    ASSESSMENT / PLAN  1. Type 2 diabetes mellitus with other specified complication, without long-term current use of insulin (HCC)    Diabetes Mellitus type 2, complicated by CKD. - Diabetic status / severity: Uncontrolled, worsening.  Lab Results  Component Value Date   HGBA1C 7.5 (A) 01/12/2024    - Hemoglobin A1c goal : <6.5%  She has CKD, will not increase the dose of metformin .  Discussed about adding third medication versus work on diet.  She prefers to work on diet, advised to limit cookies, carbs and limit meal portion.  - Medications: See below.  No change.  I) continue metformin  extended release 500 mg 1 tablet daily. II) continue Jardiance  25 mg daily.  - Home glucose testing: Advised to check blood sugar in the morning fasting and occasionally at bedtime.    - Discussed/ Gave Hypoglycemia treatment plan.  # Consult : Refer to diabetic educator/dietitian.  # Annual urine for microalbuminuria/ creatinine ratio, no microalbuminuria currently, continue Jardiance .  Patient has CKD, following with nephrology and would like to check urine microalbumin creatinine ratio with nephrology.  No results found for: MICRALBCREAT  # Foot check nightly.  # Annual dilated diabetic eye exams.   - Diet: Make healthy diabetic food choices - Life style / activity / exercise: Discussed.  2. Blood pressure  -  BP Readings from Last 1 Encounters:  01/12/24 120/66    - Control is in target. Repeat blood pressure 140/78. - No change in current plans.  3. Lipid status / Hyperlipidemia - Last  Lab Results  Component Value Date   LDLCALC 33 01/15/2023   - Continue rosuvastatin 20 mg daily.  Managed by PCP.  Cassie Wilson was seen today for diabetes.  Diagnoses and all orders for this visit:  Type 2 diabetes mellitus with  other specified complication, without long-term current use of insulin (HCC) -     POCT glycosylated hemoglobin (Hb A1C)    DISPOSITION Follow up  in clinic in 4 months suggested.   All questions answered and patient verbalized understanding of the plan.  Cassie Reen, MD [facility] Endocrinology [facility] [address] [place], [address] [place], [place] [address] Phone # [phone]  At least part of this note was generated using voice recognition software. Inadvertent word errors may have occurred, which were not recognized during the proofreading process.

## 2024-02-04 ENCOUNTER — Other Ambulatory Visit: Payer: Self-pay | Admitting: Internal Medicine

## 2024-02-04 DIAGNOSIS — I10 Essential (primary) hypertension: Secondary | ICD-10-CM

## 2024-02-11 ENCOUNTER — Other Ambulatory Visit: Payer: Self-pay | Admitting: Internal Medicine

## 2024-02-24 ENCOUNTER — Ambulatory Visit: Admitting: Internal Medicine

## 2024-02-24 ENCOUNTER — Encounter: Payer: Self-pay | Admitting: Internal Medicine

## 2024-02-24 VITALS — BP 124/78 | HR 64 | Temp 98.0°F | Ht 65.0 in | Wt 186.2 lb

## 2024-02-24 DIAGNOSIS — Z1159 Encounter for screening for other viral diseases: Secondary | ICD-10-CM

## 2024-02-24 DIAGNOSIS — N1831 Chronic kidney disease, stage 3a: Secondary | ICD-10-CM | POA: Diagnosis not present

## 2024-02-24 DIAGNOSIS — E785 Hyperlipidemia, unspecified: Secondary | ICD-10-CM

## 2024-02-24 DIAGNOSIS — Z0000 Encounter for general adult medical examination without abnormal findings: Secondary | ICD-10-CM | POA: Diagnosis not present

## 2024-02-24 DIAGNOSIS — I10 Essential (primary) hypertension: Secondary | ICD-10-CM

## 2024-02-24 DIAGNOSIS — E1169 Type 2 diabetes mellitus with other specified complication: Secondary | ICD-10-CM

## 2024-02-24 DIAGNOSIS — M810 Age-related osteoporosis without current pathological fracture: Secondary | ICD-10-CM

## 2024-02-24 LAB — LIPID PANEL
Cholesterol: 112 mg/dL (ref 28–200)
HDL: 53.5 mg/dL
LDL Cholesterol: 28 mg/dL (ref 10–99)
NonHDL: 58.52
Total CHOL/HDL Ratio: 2
Triglycerides: 153 mg/dL — ABNORMAL HIGH (ref 10.0–149.0)
VLDL: 30.6 mg/dL (ref 0.0–40.0)

## 2024-02-24 LAB — COMPREHENSIVE METABOLIC PANEL WITH GFR
ALT: 15 U/L (ref 3–35)
AST: 18 U/L (ref 5–37)
Albumin: 4.3 g/dL (ref 3.5–5.2)
Alkaline Phosphatase: 87 U/L (ref 39–117)
BUN: 19 mg/dL (ref 6–23)
CO2: 29 meq/L (ref 19–32)
Calcium: 10.8 mg/dL — ABNORMAL HIGH (ref 8.4–10.5)
Chloride: 104 meq/L (ref 96–112)
Creatinine, Ser: 1.2 mg/dL (ref 0.40–1.20)
GFR: 43.43 mL/min — ABNORMAL LOW
Glucose, Bld: 162 mg/dL — ABNORMAL HIGH (ref 70–99)
Potassium: 4.8 meq/L (ref 3.5–5.1)
Sodium: 139 meq/L (ref 135–145)
Total Bilirubin: 0.7 mg/dL (ref 0.2–1.2)
Total Protein: 7.1 g/dL (ref 6.0–8.3)

## 2024-02-24 LAB — CBC WITH DIFFERENTIAL/PLATELET
Basophils Absolute: 0 K/uL (ref 0.0–0.1)
Basophils Relative: 0.4 % (ref 0.0–3.0)
Eosinophils Absolute: 0.1 K/uL (ref 0.0–0.7)
Eosinophils Relative: 0.9 % (ref 0.0–5.0)
HCT: 45.2 % (ref 36.0–46.0)
Hemoglobin: 15.3 g/dL — ABNORMAL HIGH (ref 12.0–15.0)
Lymphocytes Relative: 18.1 % (ref 12.0–46.0)
Lymphs Abs: 1.7 K/uL (ref 0.7–4.0)
MCHC: 33.9 g/dL (ref 30.0–36.0)
MCV: 86.6 fl (ref 78.0–100.0)
Monocytes Absolute: 0.5 K/uL (ref 0.1–1.0)
Monocytes Relative: 4.8 % (ref 3.0–12.0)
Neutro Abs: 7.2 K/uL (ref 1.4–7.7)
Neutrophils Relative %: 75.8 % (ref 43.0–77.0)
Platelets: 210 K/uL (ref 150.0–400.0)
RBC: 5.22 Mil/uL — ABNORMAL HIGH (ref 3.87–5.11)
RDW: 13.3 % (ref 11.5–15.5)
WBC: 9.5 K/uL (ref 4.0–10.5)

## 2024-02-24 LAB — VITAMIN D 25 HYDROXY (VIT D DEFICIENCY, FRACTURES): VITD: 52.35 ng/mL (ref 30.00–100.00)

## 2024-02-24 LAB — HEMOGLOBIN A1C: Hgb A1c MFr Bld: 7.9 % — ABNORMAL HIGH (ref 4.6–6.5)

## 2024-02-24 NOTE — Progress Notes (Signed)
 "    Established Patient Office Visit     CC/Reason for Visit: Subsequent Medicare wellness visit and follow-up chronic conditions  HPI: Cassie Wilson is a 79 y.o. female who is coming in today for the above mentioned reasons. Past Medical History is significant for: Hypertension, hyperlipidemia, type 2 diabetes, osteoporosis.  Feeling well.  Is due for Tdap vaccines.  Is due for mammogram.  Had her bone density in 2024 and we will obtain records.   Past Medical/Surgical History: Past Medical History:  Diagnosis Date   Back pain    Diabetes mellitus without complication (HCC)    DM (diabetes mellitus), type 2 (HCC)    Gas    Hyperlipidemia associated with type 2 diabetes mellitus (HCC)    Hypertension    Osteoporosis    Panic attack    Post-menopause     Past Surgical History:  Procedure Laterality Date   neck nerve block     TRIGGER FINGER RELEASE      Social History:  reports that she has never smoked. She does not have any smokeless tobacco history on file. She reports that she does not drink alcohol and does not use drugs.  Allergies: Allergies[1]  Family History:  Family History  Problem Relation Age of Onset   Heart failure Mother    CAD Father     Current Medications[2]  Review of Systems:  Negative unless indicated in HPI.   Physical Exam: Vitals:   02/24/24 0913 02/24/24 0925  BP: 124/80 124/78  Pulse: 64   Temp: 98 F (36.7 C)   TempSrc: Oral   SpO2: 99%   Weight: 186 lb 3.2 oz (84.5 kg)   Height: 5' 5 (1.651 m)     Body mass index is 30.99 kg/m.   Physical Exam Vitals reviewed.  Constitutional:      General: She is not in acute distress.    Appearance: Normal appearance. She is not ill-appearing, toxic-appearing or diaphoretic.  HENT:     Head: Normocephalic.     Right Ear: Tympanic membrane, ear canal and external ear normal. There is no impacted cerumen.     Left Ear: Tympanic membrane, ear canal and external ear normal.  There is no impacted cerumen.     Nose: Nose normal.     Mouth/Throat:     Mouth: Mucous membranes are moist.     Pharynx: Oropharynx is clear. No oropharyngeal exudate or posterior oropharyngeal erythema.  Eyes:     General: No scleral icterus.       Right eye: No discharge.        Left eye: No discharge.     Conjunctiva/sclera: Conjunctivae normal.     Pupils: Pupils are equal, round, and reactive to light.  Neck:     Vascular: No carotid bruit.  Cardiovascular:     Rate and Rhythm: Normal rate and regular rhythm.     Pulses: Normal pulses.     Heart sounds: Normal heart sounds.  Pulmonary:     Effort: Pulmonary effort is normal. No respiratory distress.     Breath sounds: Normal breath sounds.  Abdominal:     General: Abdomen is flat. Bowel sounds are normal.     Palpations: Abdomen is soft.  Musculoskeletal:        General: Normal range of motion.     Cervical back: Normal range of motion.  Skin:    General: Skin is warm and dry.  Neurological:     General: No  focal deficit present.     Mental Status: She is alert and oriented to person, place, and time. Mental status is at baseline.  Psychiatric:        Mood and Affect: Mood normal.        Behavior: Behavior normal.        Thought Content: Thought content normal.        Judgment: Judgment normal.    Subsequent Medicare wellness visit   Visit info / Clinical Intake: Medicare Wellness Visit Type:: Subsequent Annual Wellness Visit Persons participating in visit and providing information:: patient Medicare Wellness Visit Mode:: In-person (required for WTM) Interpreter Needed?: No Pre-visit prep was completed: yes AWV questionnaire completed by patient prior to visit?: no Living arrangements:: lives with spouse/significant other Patient's Overall Health Status Rating: good Typical amount of pain: none Does pain affect daily life?: no Are you currently prescribed opioids?: no  Dietary Habits and Nutritional  Risks How many meals a day?: 2 Eats fruit and vegetables daily?: yes Most meals are obtained by: preparing own meals In the last 2 weeks, have you had any of the following?: none Diabetic:: (!) yes Any non-healing wounds?: no How often do you check your BS?: as needed Would you like to be referred to a Nutritionist or for Diabetic Management? : no  Functional Status Activities of Daily Living (to include ambulation/medication): Independent Ambulation: Independent Medication Administration: Independent Home Management (perform basic housework or laundry): Independent Manage your own finances?: yes Primary transportation is: driving Concerns about vision?: no *vision screening is required for WTM* Concerns about hearing?: no (hearing aids)  Fall Screening Falls in the past year?: 0 Number of falls in past year: 0 Was there an injury with Fall?: 0 Fall Risk Category Calculator: 0 Patient Fall Risk Level: Low Fall Risk  Fall Risk Patient at Risk for Falls Due to: No Fall Risks Fall risk Follow up: Falls evaluation completed  Home and Transportation Safety: All rugs have non-skid backing?: yes All stairs or steps have railings?: yes Grab bars in the bathtub or shower?: (!) no Have non-skid surface in bathtub or shower?: yes Good home lighting?: yes Regular seat belt use?: yes Hospital stays in the last year:: no  Cognitive Assessment Difficulty concentrating, remembering, or making decisions? : no Will 6CIT or Mini Cog be Completed: yes What year is it?: 0 points What month is it?: 0 points Give patient an address phrase to remember (5 components): the cow jumped over the moon About what time is it?: 0 points Count backwards from 20 to 1: 0 points Say the months of the year in reverse: 0 points Repeat the address phrase from earlier: 0 points 6 CIT Score: 0 points  Advance Directives (For Healthcare) Does Patient Have a Medical Advance Directive?: Yes Does patient  want to make changes to medical advance directive?: No - Patient declined Type of Advance Directive: Healthcare Power of [place]; Living will Copy of Healthcare Power of Attorney in Chart?: No - copy requested Copy of Living Will in Chart?: No - copy requested  Reviewed/Updated  Reviewed/Updated: Reviewed All (Medical, Surgical, Family, Medications, Allergies, Care Teams, Patient Goals)    Vision Screening   Right eye Left eye Both eyes  Without correction 20/30 20/30 20/30   With correction         Depression/mood:  Flowsheet Row Office Visit from 04/15/2023 in [facility]  PHQ-9 Total Score 0        Counseling: Counseling given: Not  Answered     Lab orders based on risk factors: Laboratory update will be reviewed     Screening: Patient provided with a written and personalized 5-10 year screening schedule in the AVS. Health Maintenance  Topic Date Due   Kidney health urinalysis for diabetes  Never done   Hepatitis C Screening  Never done   Pneumococcal Vaccine for age over 56 (1 of 2 - PCV) Never done   Osteoporosis screening with Bone Density Scan  Never done   Zoster (Shingles) Vaccine (2 of 2) 03/16/2019   DTaP/Tdap/Td vaccine (2 - Td or Tdap) 08/26/2020   Eye exam for diabetics  01/08/2024   COVID-19 Vaccine (9 - 2025-26 season) 05/25/2024   Hemoglobin A1C  07/12/2024   Yearly kidney function blood test for diabetes  08/19/2024   Complete foot exam   01/11/2025   Medicare Annual Wellness Visit  02/23/2025   Flu Shot  Completed   Meningitis B Vaccine  Aged Out   Breast Cancer Screening  Discontinued   Colon Cancer Screening  Discontinued     Provider List Update: Patient Care Team    Relationship Specialty Notifications Start End  Theophilus Andrews, Tully GRADE, MD PCP - General Internal Medicine  01/15/23        I have personally reviewed and noted the following in the patients chart:   Medical and social history Use of  alcohol, tobacco or illicit drugs  Current medications and supplements Functional ability and status Nutritional status Physical activity Advanced directives List of other physicians Hospitalizations, surgeries, and ER visits in previous 12 months Vitals Screenings to include cognitive, depression, and falls Referrals and appointments  In addition, I have reviewed and discussed with patient certain preventive protocols, quality metrics, and best practice recommendations. A written personalized care plan for preventive services as well as general preventive health recommendations were provided to patient.   Impression and Plan:  Type 2 diabetes mellitus with other specified complication, without long-term current use of insulin (HCC) -     Hemoglobin A1c; Future -     CBC with Differential/Platelet; Future -     Comprehensive metabolic panel with GFR; Future -     Microalbumin / creatinine urine ratio; Future  Primary hypertension  Hyperlipidemia associated with type 2 diabetes mellitus (HCC) -     Lipid panel; Future  Stage 3a chronic kidney disease (HCC)  Medicare annual wellness visit, subsequent  Encounter for hepatitis C screening test for low risk patient -     Hepatitis C antibody; Future  Age-related osteoporosis without current pathological fracture -     VITAMIN D  25 Hydroxy (Vit-D Deficiency, Fractures); Future   -Recommend routine eye and dental care. -Healthy lifestyle discussed in detail. -Labs to be updated today. -Prostate cancer screening: N/A Health Maintenance  Topic Date Due   Kidney health urinalysis for diabetes  Never done   Hepatitis C Screening  Never done   Pneumococcal Vaccine for age over 52 (1 of 2 - PCV) Never done   Osteoporosis screening with Bone Density Scan  Never done   Zoster (Shingles) Vaccine (2 of 2) 03/16/2019   DTaP/Tdap/Td vaccine (2 - Td or Tdap) 08/26/2020   Eye exam for diabetics  01/08/2024   COVID-19 Vaccine (9 -  2025-26 season) 05/25/2024   Hemoglobin A1C  07/12/2024   Yearly kidney function blood test for diabetes  08/19/2024   Complete foot exam   01/11/2025   Medicare Annual Wellness Visit  02/23/2025  Flu Shot  Completed   Meningitis B Vaccine  Aged Out   Breast Cancer Screening  Discontinued   Colon Cancer Screening  Discontinued    - Advised to update Tdap vaccination at pharmacy.  Obtain records from recent bone density. - She will schedule mammogram.     Tully Theophilus Andrews, MD [facility]     [1]  Allergies Allergen Reactions   Other     Shell fish - gi upset   Sulfa Antibiotics    Tape    Prednisone Palpitations  [2]  Current Outpatient Medications:    ALPRAZolam (XANAX XR) 0.5 MG 24 hr tablet, Take 0.5 mg by mouth daily as needed for anxiety., Disp: , Rfl:    amLODipine  (NORVASC ) 5 MG tablet, TAKE ONE TABLET BY MOUTH EVERY DAY, Disp: 90 tablet, Rfl: 1   atenolol (TENORMIN) 50 MG tablet, TAKE ONE TABLET BY MOUTH TWICE DAILY, Disp: 180 tablet, Rfl: 1   azelastine (ASTELIN) 0.1 % nasal spray, Place 2 sprays into both nostrils daily. Use in each nostril as directed, Disp: , Rfl:    Blood Glucose Monitoring Suppl DEVI, 1 each by Does not apply route in the morning, at noon, and at bedtime. May substitute to any manufacturer covered by patient's insurance., Disp: 1 each, Rfl: 0   Calcium Carbonate-Vitamin D  (CALCIUM-VITAMIN D  PO), Take by mouth. Take 1 cap a day, Disp: , Rfl:    cyclobenzaprine (FLEXERIL) 10 MG tablet, Take 10 mg by mouth 3 (three) times daily as needed for muscle spasms., Disp: , Rfl:    dutasteride (AVODART) 0.5 MG capsule, Take 0.5 mg by mouth daily., Disp: , Rfl:    empagliflozin  (JARDIANCE ) 25 MG TABS tablet, Take 1 tablet (25 mg total) by mouth daily., Disp: 90 tablet, Rfl: 3   famotidine (PEPCID) 20 MG tablet, Take 20 mg by mouth 2 (two) times daily., Disp: , Rfl:    metFORMIN  (GLUCOPHAGE -XR) 500 MG 24 hr tablet, Take 1 tablet  (500 mg total) by mouth 2 (two) times daily with a meal., Disp: 90 tablet, Rfl: 3   montelukast (SINGULAIR) 10 MG tablet, Take 10 mg by mouth as needed., Disp: , Rfl:    Multiple Vitamins-Minerals (CENTRUM ADULTS PO), Take 1 tablet by mouth., Disp: , Rfl:    potassium chloride (KLOR-CON) 20 MEQ packet, Take 20 mEq by mouth 2 (two) times daily., Disp: , Rfl:    raloxifene (EVISTA) 60 MG tablet, TAKE ONE (1) TABLET BY MOUTH EVERY DAY, Disp: 90 tablet, Rfl: 1   rosuvastatin (CRESTOR) 20 MG tablet, TAKE 1 TABLET BY MOUTH EVERY EVENING, Disp: 90 tablet, Rfl: 1   Simethicone (GAS-X PO), Take by mouth., Disp: , Rfl:   "

## 2024-02-25 LAB — HEPATITIS C ANTIBODY: Hepatitis C Ab: NONREACTIVE

## 2024-02-26 LAB — HM MAMMOGRAPHY

## 2024-03-04 ENCOUNTER — Encounter: Payer: Self-pay | Admitting: Internal Medicine

## 2024-03-04 ENCOUNTER — Other Ambulatory Visit: Payer: Self-pay | Admitting: Internal Medicine

## 2024-03-09 ENCOUNTER — Ambulatory Visit: Payer: Self-pay | Admitting: Internal Medicine

## 2024-05-12 ENCOUNTER — Ambulatory Visit: Admitting: Endocrinology
